# Patient Record
Sex: Female | Born: 1935 | Race: White | Hispanic: No | Smoking: Current every day smoker
Health system: Southern US, Community
[De-identification: ages and names within clinical notes are randomized; demographics above are authoritative.]

## PROBLEM LIST (undated history)

## (undated) DIAGNOSIS — E7800 Pure hypercholesterolemia, unspecified: Secondary | ICD-10-CM

## (undated) DIAGNOSIS — I2510 Atherosclerotic heart disease of native coronary artery without angina pectoris: Secondary | ICD-10-CM

## (undated) DIAGNOSIS — I10 Essential (primary) hypertension: Secondary | ICD-10-CM

## (undated) DIAGNOSIS — M810 Age-related osteoporosis without current pathological fracture: Secondary | ICD-10-CM

## (undated) DIAGNOSIS — I252 Old myocardial infarction: Secondary | ICD-10-CM

## (undated) DIAGNOSIS — K219 Gastro-esophageal reflux disease without esophagitis: Secondary | ICD-10-CM

## (undated) DIAGNOSIS — I639 Cerebral infarction, unspecified: Secondary | ICD-10-CM

## (undated) DIAGNOSIS — I4891 Unspecified atrial fibrillation: Secondary | ICD-10-CM

## (undated) DIAGNOSIS — B029 Zoster without complications: Secondary | ICD-10-CM

## (undated) DIAGNOSIS — I219 Acute myocardial infarction, unspecified: Secondary | ICD-10-CM

## (undated) HISTORY — DX: Acute myocardial infarction, unspecified: I21.9

## (undated) HISTORY — DX: Essential (primary) hypertension: I10

## (undated) HISTORY — PX: ANGIOPLASTY: SHX39

## (undated) HISTORY — DX: Zoster without complications: B02.9

## (undated) HISTORY — DX: Unspecified atrial fibrillation: I48.91

## (undated) HISTORY — DX: Gastro-esophageal reflux disease without esophagitis: K21.9

## (undated) HISTORY — DX: Pure hypercholesterolemia, unspecified: E78.00

## (undated) HISTORY — DX: Cerebral infarction, unspecified: I63.9

## (undated) HISTORY — PX: DILATION AND CURETTAGE OF UTERUS: SHX78

## (undated) HISTORY — DX: Age-related osteoporosis without current pathological fracture: M81.0

## (undated) HISTORY — PX: SKIN CANCER EXCISION: SHX779

---
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
## 1981-08-03 HISTORY — PX: GALLBLADDER SURGERY: SHX652

## 1998-10-25 ENCOUNTER — Other Ambulatory Visit: Admission: RE | Admit: 1998-10-25 | Discharge: 1998-10-25 | Payer: Self-pay | Admitting: Gynecology

## 1999-01-26 ENCOUNTER — Encounter: Payer: Self-pay | Admitting: Emergency Medicine

## 1999-01-26 ENCOUNTER — Emergency Department (HOSPITAL_COMMUNITY): Admission: EM | Admit: 1999-01-26 | Discharge: 1999-01-26 | Payer: Self-pay | Admitting: Emergency Medicine

## 1999-04-17 ENCOUNTER — Other Ambulatory Visit: Admission: RE | Admit: 1999-04-17 | Discharge: 1999-04-17 | Payer: Self-pay | Admitting: Gynecology

## 1999-09-30 ENCOUNTER — Encounter: Payer: Self-pay | Admitting: Gynecology

## 1999-09-30 ENCOUNTER — Encounter: Admission: RE | Admit: 1999-09-30 | Discharge: 1999-09-30 | Payer: Self-pay | Admitting: Gynecology

## 1999-11-25 ENCOUNTER — Other Ambulatory Visit: Admission: RE | Admit: 1999-11-25 | Discharge: 1999-11-25 | Payer: Self-pay | Admitting: Gynecology

## 2000-09-30 ENCOUNTER — Encounter: Payer: Self-pay | Admitting: Gynecology

## 2000-09-30 ENCOUNTER — Encounter: Admission: RE | Admit: 2000-09-30 | Discharge: 2000-09-30 | Payer: Self-pay | Admitting: Gynecology

## 2000-10-28 ENCOUNTER — Encounter: Payer: Self-pay | Admitting: Gastroenterology

## 2000-10-28 ENCOUNTER — Encounter: Admission: RE | Admit: 2000-10-28 | Discharge: 2000-10-28 | Payer: Self-pay | Admitting: Gastroenterology

## 2000-11-26 ENCOUNTER — Other Ambulatory Visit: Admission: RE | Admit: 2000-11-26 | Discharge: 2000-11-26 | Payer: Self-pay | Admitting: Gynecology

## 2001-05-24 ENCOUNTER — Ambulatory Visit (HOSPITAL_BASED_OUTPATIENT_CLINIC_OR_DEPARTMENT_OTHER): Admission: RE | Admit: 2001-05-24 | Discharge: 2001-05-24 | Payer: Self-pay | Admitting: General Surgery

## 2001-05-24 ENCOUNTER — Encounter (INDEPENDENT_AMBULATORY_CARE_PROVIDER_SITE_OTHER): Payer: Self-pay | Admitting: *Deleted

## 2001-10-12 ENCOUNTER — Encounter: Admission: RE | Admit: 2001-10-12 | Discharge: 2001-10-12 | Payer: Self-pay | Admitting: Gynecology

## 2001-10-12 ENCOUNTER — Encounter: Payer: Self-pay | Admitting: Gynecology

## 2001-12-20 ENCOUNTER — Other Ambulatory Visit: Admission: RE | Admit: 2001-12-20 | Discharge: 2001-12-20 | Payer: Self-pay | Admitting: Gynecology

## 2002-01-06 ENCOUNTER — Encounter: Payer: Self-pay | Admitting: Gynecology

## 2002-01-06 ENCOUNTER — Encounter: Admission: RE | Admit: 2002-01-06 | Discharge: 2002-01-06 | Payer: Self-pay | Admitting: Gynecology

## 2002-07-14 ENCOUNTER — Encounter (INDEPENDENT_AMBULATORY_CARE_PROVIDER_SITE_OTHER): Payer: Self-pay | Admitting: Specialist

## 2002-07-14 ENCOUNTER — Ambulatory Visit (HOSPITAL_COMMUNITY): Admission: RE | Admit: 2002-07-14 | Discharge: 2002-07-14 | Payer: Self-pay | Admitting: Gastroenterology

## 2002-10-25 ENCOUNTER — Encounter: Admission: RE | Admit: 2002-10-25 | Discharge: 2002-10-25 | Payer: Self-pay | Admitting: Gynecology

## 2002-10-25 ENCOUNTER — Encounter: Payer: Self-pay | Admitting: Gynecology

## 2003-10-29 ENCOUNTER — Encounter: Admission: RE | Admit: 2003-10-29 | Discharge: 2003-10-29 | Payer: Self-pay | Admitting: Gynecology

## 2003-11-15 ENCOUNTER — Other Ambulatory Visit: Admission: RE | Admit: 2003-11-15 | Discharge: 2003-11-15 | Payer: Self-pay | Admitting: Gynecology

## 2004-11-05 ENCOUNTER — Encounter: Admission: RE | Admit: 2004-11-05 | Discharge: 2004-11-05 | Payer: Self-pay | Admitting: Gynecology

## 2005-09-23 ENCOUNTER — Encounter: Admission: RE | Admit: 2005-09-23 | Discharge: 2005-09-23 | Payer: Self-pay | Admitting: Orthopedic Surgery

## 2005-10-07 ENCOUNTER — Encounter: Admission: RE | Admit: 2005-10-07 | Discharge: 2005-10-07 | Payer: Self-pay | Admitting: Orthopedic Surgery

## 2005-11-19 ENCOUNTER — Encounter: Admission: RE | Admit: 2005-11-19 | Discharge: 2005-11-19 | Payer: Self-pay | Admitting: Gynecology

## 2005-11-25 ENCOUNTER — Other Ambulatory Visit: Admission: RE | Admit: 2005-11-25 | Discharge: 2005-11-25 | Payer: Self-pay | Admitting: Gynecology

## 2005-12-14 ENCOUNTER — Encounter: Admission: RE | Admit: 2005-12-14 | Discharge: 2005-12-14 | Payer: Self-pay | Admitting: Gastroenterology

## 2006-11-22 ENCOUNTER — Encounter: Admission: RE | Admit: 2006-11-22 | Discharge: 2006-11-22 | Payer: Self-pay | Admitting: Gynecology

## 2006-11-29 ENCOUNTER — Other Ambulatory Visit: Admission: RE | Admit: 2006-11-29 | Discharge: 2006-11-29 | Payer: Self-pay | Admitting: Gynecology

## 2007-10-05 ENCOUNTER — Encounter: Admission: RE | Admit: 2007-10-05 | Discharge: 2007-10-05 | Payer: Self-pay | Admitting: Gastroenterology

## 2007-11-30 ENCOUNTER — Other Ambulatory Visit: Admission: RE | Admit: 2007-11-30 | Discharge: 2007-11-30 | Payer: Self-pay | Admitting: Gynecology

## 2007-12-05 ENCOUNTER — Encounter: Admission: RE | Admit: 2007-12-05 | Discharge: 2007-12-05 | Payer: Self-pay | Admitting: Gynecology

## 2008-01-05 ENCOUNTER — Encounter: Admission: RE | Admit: 2008-01-05 | Discharge: 2008-01-05 | Payer: Self-pay | Admitting: Orthopedic Surgery

## 2008-12-05 ENCOUNTER — Encounter: Admission: RE | Admit: 2008-12-05 | Discharge: 2008-12-05 | Payer: Self-pay | Admitting: Gynecology

## 2008-12-12 ENCOUNTER — Encounter: Payer: Self-pay | Admitting: Gynecology

## 2008-12-12 ENCOUNTER — Other Ambulatory Visit: Admission: RE | Admit: 2008-12-12 | Discharge: 2008-12-12 | Payer: Self-pay | Admitting: Gynecology

## 2008-12-12 ENCOUNTER — Ambulatory Visit: Payer: Self-pay | Admitting: Gynecology

## 2009-12-17 ENCOUNTER — Encounter: Admission: RE | Admit: 2009-12-17 | Discharge: 2009-12-17 | Payer: Self-pay | Admitting: Gynecology

## 2010-01-16 ENCOUNTER — Other Ambulatory Visit: Admission: RE | Admit: 2010-01-16 | Discharge: 2010-01-16 | Payer: Self-pay | Admitting: Gynecology

## 2010-01-16 ENCOUNTER — Ambulatory Visit: Payer: Self-pay | Admitting: Gynecology

## 2010-02-25 ENCOUNTER — Ambulatory Visit: Payer: Self-pay | Admitting: Gynecology

## 2010-08-24 ENCOUNTER — Encounter: Payer: Self-pay | Admitting: Orthopedic Surgery

## 2010-11-05 ENCOUNTER — Other Ambulatory Visit: Payer: Self-pay | Admitting: Dermatology

## 2010-12-23 ENCOUNTER — Other Ambulatory Visit: Payer: Self-pay | Admitting: Dermatology

## 2010-12-30 ENCOUNTER — Other Ambulatory Visit: Payer: Self-pay | Admitting: Gynecology

## 2010-12-30 DIAGNOSIS — Z1231 Encounter for screening mammogram for malignant neoplasm of breast: Secondary | ICD-10-CM

## 2011-01-14 ENCOUNTER — Ambulatory Visit
Admission: RE | Admit: 2011-01-14 | Discharge: 2011-01-14 | Disposition: A | Discharge status: Home / Self Care | Payer: Medicare Other | Source: Ambulatory Visit | Attending: Gynecology | Admitting: Gynecology

## 2011-01-14 DIAGNOSIS — Z1231 Encounter for screening mammogram for malignant neoplasm of breast: Secondary | ICD-10-CM

## 2011-01-20 ENCOUNTER — Encounter (INDEPENDENT_AMBULATORY_CARE_PROVIDER_SITE_OTHER): Payer: Medicare Other | Admitting: Gynecology

## 2011-01-20 DIAGNOSIS — R82998 Other abnormal findings in urine: Secondary | ICD-10-CM

## 2011-01-20 DIAGNOSIS — N952 Postmenopausal atrophic vaginitis: Secondary | ICD-10-CM

## 2011-07-06 ENCOUNTER — Other Ambulatory Visit: Payer: Self-pay | Admitting: Dermatology

## 2011-12-08 ENCOUNTER — Other Ambulatory Visit: Payer: Self-pay | Admitting: Gynecology

## 2011-12-08 DIAGNOSIS — Z1231 Encounter for screening mammogram for malignant neoplasm of breast: Secondary | ICD-10-CM

## 2012-01-18 ENCOUNTER — Ambulatory Visit
Admission: RE | Admit: 2012-01-18 | Discharge: 2012-01-18 | Disposition: A | Discharge status: Home / Self Care | Payer: Medicare Other | Source: Ambulatory Visit | Attending: Gynecology | Admitting: Gynecology

## 2012-01-18 DIAGNOSIS — Z1231 Encounter for screening mammogram for malignant neoplasm of breast: Secondary | ICD-10-CM

## 2012-01-20 ENCOUNTER — Other Ambulatory Visit: Payer: Self-pay | Admitting: *Deleted

## 2012-01-20 DIAGNOSIS — N63 Unspecified lump in unspecified breast: Secondary | ICD-10-CM

## 2012-01-21 ENCOUNTER — Ambulatory Visit (INDEPENDENT_AMBULATORY_CARE_PROVIDER_SITE_OTHER): Payer: Medicare Other | Admitting: Gynecology

## 2012-01-21 ENCOUNTER — Encounter: Payer: Self-pay | Admitting: Gynecology

## 2012-01-21 VITALS — BP 130/80 | Ht 62.5 in | Wt 160.0 lb

## 2012-01-21 DIAGNOSIS — M4000 Postural kyphosis, site unspecified: Secondary | ICD-10-CM

## 2012-01-21 DIAGNOSIS — B029 Zoster without complications: Secondary | ICD-10-CM | POA: Insufficient documentation

## 2012-01-21 DIAGNOSIS — N8111 Cystocele, midline: Secondary | ICD-10-CM

## 2012-01-21 DIAGNOSIS — I10 Essential (primary) hypertension: Secondary | ICD-10-CM | POA: Insufficient documentation

## 2012-01-21 DIAGNOSIS — K219 Gastro-esophageal reflux disease without esophagitis: Secondary | ICD-10-CM | POA: Insufficient documentation

## 2012-01-21 DIAGNOSIS — I219 Acute myocardial infarction, unspecified: Secondary | ICD-10-CM | POA: Insufficient documentation

## 2012-01-21 DIAGNOSIS — M8580 Other specified disorders of bone density and structure, unspecified site: Secondary | ICD-10-CM | POA: Insufficient documentation

## 2012-01-21 DIAGNOSIS — N952 Postmenopausal atrophic vaginitis: Secondary | ICD-10-CM

## 2012-01-21 DIAGNOSIS — E7800 Pure hypercholesterolemia, unspecified: Secondary | ICD-10-CM | POA: Insufficient documentation

## 2012-01-21 DIAGNOSIS — M949 Disorder of cartilage, unspecified: Secondary | ICD-10-CM

## 2012-01-21 DIAGNOSIS — I4891 Unspecified atrial fibrillation: Secondary | ICD-10-CM | POA: Insufficient documentation

## 2012-01-21 DIAGNOSIS — M40209 Unspecified kyphosis, site unspecified: Secondary | ICD-10-CM

## 2012-01-21 NOTE — Progress Notes (Signed)
Chloe Neal [facility] Feb 17, 1929 [id number]        78 y.o.  for follow up exam.    Past medical history,surgical history, medications, allergies, family history and social history were all reviewed and documented in the EPIC chart. ROS:  Was performed and pertinent positives and negatives are included in the history.  Exam: Chloe Neal chaperone present Filed Vitals:   01/21/12 0946  BP: 130/80   General appearance  Normal Skin grossly normal Head/Neck normal with no cervical or supraclavicular adenopathy thyroid normal Lungs  Clear, kyphosis upper thorax noted Cardiac irregular rhythm, without RMG Abdominal  soft, nontender, without masses, organomegaly or hernia Breasts  examined lying and sitting without masses, retractions, discharge or axillary adenopathy. Pelvic  Ext/BUS/vagina  normal with atrophic changes. Mild midline cystocele.  Cervix  normal with atrophic changes  Uterus  axial, normal size, shape and contour, midline and mobile nontender   Adnexa  Without masses or tenderness    Anus and perineum  normal   Rectovaginal  normal sphincter tone without palpated masses or tenderness.    Assessment/Plan:  78 y.o. female for follow up exam.    1. Atrophic genital changes/mild cystocele. Patient's asymptomatic and we'll continue to monitor. 2. Kyphosis/osteopenia. Patient due for follow up DEXA now. Her spine on review it is somewhat difficult to interpret due to degenerative changes. Going to order a VFA along with the DEXA. 3. Mammography. Patient has follow up views scheduled and will follow up for those. SBE monthly reviewed. 4. Pap smear. Last Pap smear 2011. No Pap smear done today. She has no history of abnormal Pap smears before with numerous normal reports in her chart. Reviewed current screening guidelines and we will stop doing screening as she is over 65. 5. Colonoscopy. She is up-to-date with colonoscopy and will follow up at recommended intervals. 6. Health maintenance. No  blood work was done today this is all done through her other physicians who follow her for her medical issues.    Demeshia Lords MD, 10:14 AM 01/21/2012

## 2012-01-21 NOTE — Patient Instructions (Signed)
Follow up for bone density study as scheduled. Follow up with me in one year for follow up exam.

## 2012-01-27 ENCOUNTER — Encounter: Payer: Self-pay | Admitting: Gynecology

## 2012-02-10 ENCOUNTER — Ambulatory Visit
Admission: RE | Admit: 2012-02-10 | Discharge: 2012-02-10 | Disposition: A | Discharge status: Home / Self Care | Payer: Medicare Other | Source: Ambulatory Visit | Attending: Gynecology | Admitting: Gynecology

## 2012-02-10 DIAGNOSIS — N63 Unspecified lump in unspecified breast: Secondary | ICD-10-CM

## 2012-03-09 ENCOUNTER — Ambulatory Visit (INDEPENDENT_AMBULATORY_CARE_PROVIDER_SITE_OTHER): Payer: Medicare Other

## 2012-03-09 DIAGNOSIS — M810 Age-related osteoporosis without current pathological fracture: Secondary | ICD-10-CM

## 2012-03-09 DIAGNOSIS — M40209 Unspecified kyphosis, site unspecified: Secondary | ICD-10-CM

## 2012-03-09 DIAGNOSIS — M8580 Other specified disorders of bone density and structure, unspecified site: Secondary | ICD-10-CM

## 2012-03-09 DIAGNOSIS — M4000 Postural kyphosis, site unspecified: Secondary | ICD-10-CM

## 2012-03-19 DIAGNOSIS — M810 Age-related osteoporosis without current pathological fracture: Secondary | ICD-10-CM

## 2012-03-19 HISTORY — DX: Age-related osteoporosis without current pathological fracture: M81.0

## 2012-03-25 ENCOUNTER — Encounter: Payer: Self-pay | Admitting: Gynecology

## 2012-03-25 ENCOUNTER — Telehealth: Payer: Self-pay | Admitting: Gynecology

## 2012-03-25 NOTE — Telephone Encounter (Signed)
Recommend OV to discuss bone density results.

## 2012-03-28 NOTE — Telephone Encounter (Signed)
SPOKE W/ MS Kashani, APPT SCHEDULED FOR Friday SEPT 6 @ 10:20 W/DR TF TO DISCUSS DEXA/DBM

## 2012-04-08 ENCOUNTER — Ambulatory Visit (INDEPENDENT_AMBULATORY_CARE_PROVIDER_SITE_OTHER): Payer: Medicare Other | Admitting: Gynecology

## 2012-04-08 ENCOUNTER — Encounter: Payer: Self-pay | Admitting: Gynecology

## 2012-04-08 DIAGNOSIS — M810 Age-related osteoporosis without current pathological fracture: Secondary | ICD-10-CM

## 2012-04-08 MED ORDER — ALENDRONATE SODIUM 70 MG PO TABS: 70.0000 mg | ORAL_TABLET | ORAL | Status: DC

## 2012-04-08 NOTE — Progress Notes (Signed)
Patient presents to discuss her recent DEXA and VFA.  Her DEXA shows osteoporosis in her forearm. Degenerative changes in the lower lumbar spine. Her VFA shows evidence of some compression fractures although this is a little more difficult to interpret.  I reviewed all these findings with her and given her age and a history of a fall with fracture in her forearm years ago I've recommended that we start treatment. Options were reviewed with her to include bisphosphonates, Prolia, Evista/hormonal, Forteo. Pros/cons, risks/benefits of choice is reviewed. We'll start with alendronate 70 mg weekly. How to take the medication and side effect profile were reviewed. Risks of reflux, esophageal cancer, osteonecrosis of the jaw atypical fractures reviewed. Arrhythmias with IV also discussed. Patient is comfortable starting and assuming she does well then we'll repeat her DEXA in 2 years.

## 2012-04-08 NOTE — Patient Instructions (Signed)
Osteoporosis  Osteoporosis is a disease of the bones that makes them weaker and prone to break (fracture). By their mid-30s, most people begin to gradually lose bone strength. If this is severe enough, osteoporosis may occur. Osteopenia is a less severe weakness of the bones, which places you at risk for osteoporosis. It is important to identify if you have osteoporosis or osteopenia. Bone fractures from osteoporosis (especially hip and spine fractures) are a major cause of hospitalization, loss of independence, and can lead to life-threatening complications.  CAUSES    There are a number of causes and risk factors:   Gender. Women are at a higher risk for osteoporosis than men.    Age. Bone formation slows down with age.    Ethnicity. For unclear reasons, white and Asian women are at higher risk for osteoporosis. Hispanic and African American women are at increased, but lesser, risk.    Family history of osteoporosis can mean that you are at a higher risk for getting it.    History of bone fractures indicates you may be at higher risk of another.    Calcium is very important for bone health and strength. Not enough calcium in your diet increases your risk for osteoporosis. Vitamin D is important for calcium metabolism. You get vitamin D from sunlight, foods, or supplements.    Physical activity. Bones get stronger with weight-bearing exercise and weaker without use.    Smoking is associated with decreased bone strength.    Medicines. Cortisone medicines, too much thyroid medicine, some cancer and seizure medicines, and others can weaken bones and cause osteoporosis.    Decreased body weight is associated with osteoporosis. The small amount of estrogen-type molecules produced in fat cells seems to protect the bones.    Menopausal decrease in the hormone estrogen can cause osteoporosis.    Low levels of the hormone testosterone can cause osteoporosis.     Some medical conditions can lead to osteoporosis (hyperthyroidism, hyperparathyroidism, B12 deficiency).   SYMPTOMS    Usually, no symptoms are felt as the bones weaken. The first symptoms are generally related to bone fractures. You may have silent, tiny bone fractures, especially in your spine. This can cause height loss and forward bending of the spine (kyphosis).  DIAGNOSIS    You or your caregiver may suspect osteoporosis based on height loss and kyphosis. Osteoporosis or osteopenia may be identified on an X-ray done for other reasons. A bone density measurement will likely be taken. Your bones are often measured at your lower spine or your hips. Measurement is done by an X-ray called a DEXA scan, or sometimes by a computerized X-ray scan (CT or CAT scan). Other tests may be done to find the cause of osteoporosis, such as blood tests to measure calcium and vitamin D, or to monitor treatment.  TREATMENT    The goal of osteoporosis treatment is to prevent fractures. This is done through medicine and home care treatments. Treatment will slow the weakening of your bones and strengthen them where possible. Measures to decrease the likelihood of falling and fracturing a bone are also important.  Medicine   You may need supplements if you are not getting enough calcium, vitamin D, and vitamin B12.    If you are female and menopausal, you should discuss the option of estrogen replacement or estrogen-like medicine with your caregiver.    Medicines can be taken by mouth or injection to help build bone strength. When taken by mouth, there are important directions   that you need to follow.    Calcitonin is a hormone made by the thyroid gland that can help build bone strength and decrease fracture risk in the spine. It can be taken by nasal spray or injection.    Parathyroid hormone can be injected to help build bone strength.    You will need to continue to get enough calcium intake with any of these medicines.    FALL PREVENTION   If you are unsteady on your feet, use a cane, walker, or walk with someone's help.    Remove loose rugs or electrical cords from your home.    Keep your home well lit at night. Use glasses if you need them.    Avoid icy streets and wet or waxed floors.    Hold the railing when using stairs.    Watch out for your pets.    Install grab bars in your bathroom.    Exercise. Physical activity, especially weight-bearing exercise, helps strengthen bones. Strength and balance exercise, such as tai chi, helps prevent falls.    Alcohol and some medicines can make you more likely to fall. Discuss alcohol use with your caregiver. Ask your caregiver if any of your medicines might increase your risk for falling. Ask if safer alternatives are available.   HOME CARE INSTRUCTIONS     Try to prevent and avoid falls.    To pick up objects, bend at the knees. Do not bend with your back.    Do not smoke. If you smoke, ask for help to stop.    Have adequate calcium and vitamin D in your diet. Talk with your caregiver about amounts.    Before exercising, ask your caregiver what exercises will be good for you.    Only take over-the-counter or prescription medicines for pain, discomfort, or fever as directed by your caregiver.   SEEK MEDICAL CARE IF:     You have had a fracture and your pain is not controlled.    You have had a fracture and you are not able to return to activities as expected.    You are reinjured.    You develop side effects from medicines, especially stomach pain or trouble swallowing.    You develop new, unexplained problems.   SEEK IMMEDIATE MEDICAL CARE IF:     You develop sudden, severe pain in your back.    You develop pain after an injury or fall.   Document Released: 04/29/2005 Document Revised: 07/09/2011 Document Reviewed: 07/04/2011  [employer] Patient Information 2012 [employer].

## 2012-05-02 ENCOUNTER — Telehealth: Payer: Self-pay | Admitting: *Deleted

## 2012-05-02 NOTE — Telephone Encounter (Signed)
I don't think that this is a reaction to Fosamax as it is not commonly reported. I recommend she call her primary physician's office and asked them why they think she is swelling. Asked them if they think it is a side effect of her Fosamax and we can consider switching but otherwise I'm not sure switching to anything else would make a big difference.

## 2012-05-02 NOTE — Telephone Encounter (Signed)
Pt has completed 3 weeks of generic Fosomax, she noticed she began having swelling in feet ankles and legs. She went to her PCP and they gave her Fluid pills and potassium since that was low. She asks if this is a reaction to the Fosomax and if she could get another prescription instead of the Generic Fosomax. Pls advise

## 2012-05-03 NOTE — Telephone Encounter (Signed)
Informed and will follow up with PCP KW

## 2012-05-25 ENCOUNTER — Other Ambulatory Visit: Payer: Self-pay | Admitting: Internal Medicine

## 2012-05-25 ENCOUNTER — Ambulatory Visit
Admission: RE | Admit: 2012-05-25 | Discharge: 2012-05-25 | Disposition: A | Discharge status: Home / Self Care | Payer: Medicare Other | Source: Ambulatory Visit | Attending: Internal Medicine | Admitting: Internal Medicine

## 2012-05-25 DIAGNOSIS — R279 Unspecified lack of coordination: Secondary | ICD-10-CM

## 2012-05-25 DIAGNOSIS — R609 Edema, unspecified: Secondary | ICD-10-CM

## 2012-05-25 DIAGNOSIS — R0989 Other specified symptoms and signs involving the circulatory and respiratory systems: Secondary | ICD-10-CM

## 2012-05-26 ENCOUNTER — Other Ambulatory Visit: Payer: Self-pay | Admitting: Internal Medicine

## 2012-05-26 DIAGNOSIS — R609 Edema, unspecified: Secondary | ICD-10-CM

## 2012-05-30 ENCOUNTER — Ambulatory Visit
Admission: RE | Admit: 2012-05-30 | Discharge: 2012-05-30 | Disposition: A | Discharge status: Home / Self Care | Payer: Medicare Other | Source: Ambulatory Visit | Attending: Internal Medicine | Admitting: Internal Medicine

## 2012-05-30 DIAGNOSIS — R279 Unspecified lack of coordination: Secondary | ICD-10-CM

## 2012-05-30 DIAGNOSIS — R0989 Other specified symptoms and signs involving the circulatory and respiratory systems: Secondary | ICD-10-CM

## 2012-05-30 MED ORDER — IOHEXOL 300 MG/ML  SOLN
75.0000 mL | Freq: Once | INTRAMUSCULAR | Status: AC | PRN
  Administered 2012-05-30: 75 mL via INTRAVENOUS

## 2012-06-01 ENCOUNTER — Other Ambulatory Visit: Payer: Self-pay | Admitting: Internal Medicine

## 2012-06-01 ENCOUNTER — Ambulatory Visit
Admission: RE | Admit: 2012-06-01 | Discharge: 2012-06-01 | Disposition: A | Discharge status: Home / Self Care | Payer: Medicare Other | Source: Ambulatory Visit | Attending: Internal Medicine | Admitting: Internal Medicine

## 2012-06-01 VITALS — BP 171/82 | HR 94 | Temp 98.6°F | Resp 18 | Ht 63.0 in | Wt 155.0 lb

## 2012-06-01 DIAGNOSIS — R609 Edema, unspecified: Secondary | ICD-10-CM

## 2012-06-02 NOTE — Consult Note (Signed)
INTERVENTIONAL RADIOLOGY NEW PATIENT EVALUATION  Reason for Consult: Lower extremity edema, evaluate for possible venous insufficiency Referring Physician: Marden Noble, MD   HPI: Chloe Neal is an 78 y.o. female with a past medical history significant for hypertension, acute myocardial infarction in 24-Dec-1992 status post PTCA of the right coronary artery, and atrial fibrillation who developed relatively sudden onset of lower extremity edema in mid September.  Upon questioning, she has had intermittent edema prior to this however; it has always been self-limited and not very bothersome.  In the month of September, she feels that she suddenly noticed that her legs were swollen and the swelling was persistent.  She reports that it is not improved with elevation and does not seem to be affected or exacerbated by prolonged standing or activity.  The edema remains constant throughout the day and is neither better nor worse in the morning or evening.  The onset of her symptoms was temporally related to beginning Fosamax which has since been discontinued.  She feels that her edema is similar to perhaps incrementally better since stopping the Fosamax.  She has some mild blistering of the skin along the anterior aspect of the shins.  She denies pain, claudication, heaviness in the legs, burning or itching associated with the edema.  She reports that she walks with the assistance of a cane and can walk up to one mile before she becomes tired.  She denies history of a prior DVT, significant leg injury or surgery.  Past Medical History:   Hypertension, acute myocardial infarction status post PTCA of the right coronary artery in 12-24-92, hypercholesterolemia, COPD (stopped smoking March 2013), idiopathic thrombocytopenic purpura, nocturia, shingles, diverticulosis, bilateral hearing aids, degenerative disk disease with chronic low back pain, bilateral carotid artery disease, multinodular goiter, and macular  degeneration.  Past Surgical History:   Tonsillectomy, cholecystectomy, uterine polypectomy, toe surgery, removal of facial sebaceous cyst, and aspiration of a breast cyst.  Family History:   History of coronary artery disease in her father who passed away at 1 years old secondary to myocardial infarction.  Her mother also had peripheral vascular disease with a history of cerebrovascular accident.      Social History:   One pack per day smoker for 50 years; she stopped smoking in March of 2013.  She does consume caffeine, approximately 3-6 cups daily.  She denies alcohol or illicit drug use.  She is retired; she was previously an Environmental health practitioner at [employer].  She is currently a widow after being married for 53 years.  Her husband passed away in 12/25/02.  She currently lives alone.  She has two children, including her daughter who is at her appointment with her today.  Medications:   Warfarin 5 mg po Monday and Friday and 2.5 mg po on the other days of the week.  Aspirin 81 mg po, vitamin D 2000 units po, Prilosec OTC 20 mg po, calcium 600+D po, multivitamin tablet po, Ocuvite tablet po, Biotin 1000 mcg tablet po, Nitrostat 0.4 mg sublingual prn, CoQ10 100 mg po, Spiriva 18 mcg inhaler prn, Cardizem 300 mg extended release po, Losartin potassium 100 mg po, Lipitor 20 mg po, furosemide 20 mg 1-2 tablets po, potassium chloride 10 mEq extended release tablets 1-2 daily, oxybutynin 10 mg daily.    Allergies:  History of syncopal reaction with metoprolol, unlikely a true allergy.  Physical Exam:   Vital signs: Blood pressure 171/82, heart rate 94, respiratory rate 18, oxygen saturation 98%, temperature 98.6 F. Cardiac:  Irregularly irregular rhythm.  No murmurs, rubs or gallops.   General:  Very pleasant, well kempt female in no acute distress who appears younger than her stated age.   Pulmonary:  Clear to auscultation bilaterally.   Abdomen:  Soft, nontender, nondistended.  No pulsatile  mass.  No bruit.  Lower extremities:  There is 2+ pitting edema extending from the dorsum of the foot to the upper third of the lower leg.  The edema stops before the knee.  The edema is predominantly along the anterior surface of the shin and dorsum of the foot.  There is fairly low edema along the posterior aspect of the calf.  There are several small fluid-containing vesicles in the skin of the anterior calves bilaterally without a significant weeping.  No significant venous dermatitis or lipodermatosclerosis.  No trophic changes along the malleoli in the typical gaiter distribution.  The patient does have some trophic changes in her toenails consistent with mild peripheral arterial disease.  No definite arterial or venous ulcers identified.  No significant venous varicosities.  There are multiple small spider and reticular veins along the medial aspect of the bilateral lower extremities in the upper thigh and around the knees.   Pulses:  2+ palpable femoral pulses bilaterally but Doppler positive dorsalis pedis exam posterior tibial pulses on the left and posterior tibial pulse on the right.  The right dorsalis pedis artery is not detectable by Doppler ultrasound.   Neuro:  Alert and oriented x3.  Normal affect.   Review of Symptoms:  A 14-point review of symptoms was negative save for the pertinent positives listed in the history of the present illness.   Imaging:  Venous duplex ultrasound performed in clinic, 06/01/12 showed no evidence of deep venous thrombosis or venous insufficiency.  Assessment & Plan:    Chloe Neal is an 78 year old female with a relatively sudden onset of bilateral lower extremity pitting edema approximately six weeks ago.  Her edema has been persistent and relatively unchanging, and does not seem to respond to elevation.  She does not have evidence of venous insufficiency/reflux by Doppler ultrasonography.  Additionally, her edema distribution is predominantly anterior and  she does not show classical clinical signs of venous stasis dermatitis.    We could consider evaluating her thyroid function as hypothyroidism can be associated with pretibial edema.  I feel that her lower extremity edema may be multifactorial.   I gave her a prescription for knee-high TED hose.  I think the compression may help her underlying edema.   It was a pleasure seeing Chloe Neal in clinic today.  If I can be of any further assistance, I would be more than happy to see her again.   Signed,  Sterling Big, MD Vascular and Interventional Radiologist [facility]

## 2012-06-03 ENCOUNTER — Other Ambulatory Visit: Payer: Self-pay | Admitting: Family Medicine

## 2012-06-03 DIAGNOSIS — R609 Edema, unspecified: Secondary | ICD-10-CM

## 2012-06-06 ENCOUNTER — Ambulatory Visit
Admission: RE | Admit: 2012-06-06 | Discharge: 2012-06-06 | Disposition: A | Discharge status: Home / Self Care | Payer: Medicare Other | Source: Ambulatory Visit | Attending: Family Medicine | Admitting: Family Medicine

## 2012-06-06 DIAGNOSIS — R609 Edema, unspecified: Secondary | ICD-10-CM

## 2012-06-06 MED ORDER — IOHEXOL 300 MG/ML  SOLN
100.0000 mL | Freq: Once | INTRAMUSCULAR | Status: AC | PRN
  Administered 2012-06-06: 100 mL via INTRAVENOUS

## 2012-06-22 ENCOUNTER — Other Ambulatory Visit: Payer: Self-pay | Admitting: Internal Medicine

## 2012-06-22 DIAGNOSIS — H5340 Unspecified visual field defects: Secondary | ICD-10-CM

## 2012-06-26 ENCOUNTER — Ambulatory Visit
Admission: RE | Admit: 2012-06-26 | Discharge: 2012-06-26 | Disposition: A | Discharge status: Home / Self Care | Payer: Medicare Other | Source: Ambulatory Visit | Attending: Internal Medicine | Admitting: Internal Medicine

## 2012-06-26 DIAGNOSIS — H5340 Unspecified visual field defects: Secondary | ICD-10-CM

## 2012-07-08 ENCOUNTER — Other Ambulatory Visit: Payer: Self-pay | Admitting: Neurology

## 2012-07-08 DIAGNOSIS — I4891 Unspecified atrial fibrillation: Secondary | ICD-10-CM

## 2012-07-08 DIAGNOSIS — I679 Cerebrovascular disease, unspecified: Secondary | ICD-10-CM

## 2012-07-08 DIAGNOSIS — I6340 Cerebral infarction due to embolism of unspecified cerebral artery: Secondary | ICD-10-CM

## 2012-07-12 ENCOUNTER — Ambulatory Visit
Admission: RE | Admit: 2012-07-12 | Discharge: 2012-07-12 | Disposition: A | Discharge status: Home / Self Care | Payer: Medicare Other | Source: Ambulatory Visit | Attending: Neurology | Admitting: Neurology

## 2012-07-12 DIAGNOSIS — I6340 Cerebral infarction due to embolism of unspecified cerebral artery: Secondary | ICD-10-CM

## 2012-07-12 DIAGNOSIS — I4891 Unspecified atrial fibrillation: Secondary | ICD-10-CM

## 2012-07-12 DIAGNOSIS — I679 Cerebrovascular disease, unspecified: Secondary | ICD-10-CM

## 2012-12-22 ENCOUNTER — Other Ambulatory Visit: Payer: Self-pay

## 2012-12-22 DIAGNOSIS — Z1231 Encounter for screening mammogram for malignant neoplasm of breast: Secondary | ICD-10-CM

## 2013-01-23 ENCOUNTER — Ambulatory Visit (INDEPENDENT_AMBULATORY_CARE_PROVIDER_SITE_OTHER): Payer: Medicare Other | Admitting: Gynecology

## 2013-01-23 ENCOUNTER — Encounter: Payer: Self-pay | Admitting: Gynecology

## 2013-01-23 ENCOUNTER — Telehealth: Payer: Self-pay | Admitting: *Deleted

## 2013-01-23 VITALS — BP 130/80 | Ht 63.0 in | Wt 146.0 lb

## 2013-01-23 DIAGNOSIS — N952 Postmenopausal atrophic vaginitis: Secondary | ICD-10-CM

## 2013-01-23 DIAGNOSIS — M810 Age-related osteoporosis without current pathological fracture: Secondary | ICD-10-CM

## 2013-01-23 DIAGNOSIS — N63 Unspecified lump in unspecified breast: Secondary | ICD-10-CM

## 2013-01-23 DIAGNOSIS — N6320 Unspecified lump in the left breast, unspecified quadrant: Secondary | ICD-10-CM

## 2013-01-23 DIAGNOSIS — N8111 Cystocele, midline: Secondary | ICD-10-CM

## 2013-01-23 NOTE — Progress Notes (Signed)
Chloe Neal [facility] February 05, 1929 [id number]        79 y.o.  [id number] for followup exam.  Several issues noted below.  Past medical history,surgical history, medications, allergies, family history and social history were all reviewed and documented in the EPIC chart.  ROS:  Performed and pertinent positives and negatives are included in the history, assessment and plan .  Exam: Chloe Neal assistant Filed Vitals:   01/23/13 1504  BP: 130/80  Height: 5\' 3"  (1.6 m)  Weight: 146 lb (66.225 kg)   General appearance  Normal Skin grossly normal Head/Neck normal with no cervical or supraclavicular adenopathy thyroid normal Lungs  clear Cardiac RR, without RMG Abdominal  soft, nontender, without masses, organomegaly or hernia Breasts  examined lying and sitting. Right without masses, retractions, discharge or axillary adenopathy. Left with soft well circumscribed 1.5 cm mass 9:00 position 1 fingerbreadth off of the areola. Freely mobile with no overlying skin changes. No nipple discharge or axillary adenopathy. Pelvic  Ext/BUS/vagina  atrophic changes with first degree cystocele  Cervix  atrophic changes   Uterus axial, normal size, shape and contour, midline and mobile nontender   Adnexa  Without masses or tenderness    Anus and perineum  normal   Rectovaginal  normal sphincter tone without palpated masses or tenderness.    Assessment/Plan:  79 y.o. [id number] female for annual exam.   1. Breast mass, left described above. Plan diagnostic mammography and ultrasound. Patient actually has a screening one scheduled tomorrow and we will call over to change this. 2. Osteoporosis. As per note 04/08/2012. Patient started on Fosamax but had some peripheral swelling and stop this. She was to speak to her primary in reference to this but never did. She does have other reasons for swelling and is on Lasix. I reviewed her diagnosis of osteoporosis and really did need to be on some medication to help decrease her  fracture risk. Options to include trial Prolia discussed. The patient would rather start her Fosamax again and see how she does. If she does have recurrent issues with this she'll call in and we'll move towards getting her started on Prolia. The importance of taking some medication to help decrease her fracture risk given her age, instability with walking and bone density showing osteoporosis. 3. Atrophic vaginitis/cystocele. Patient's asymptomatic from this and we'll continue to monitor. 4. Pap smear 2011. No Pap smear done today. No history of significant abnormal Pap smears previously. Plan to stop screening per current screening guidelines that she is over the age of 22 and she is comfortable with this. 5. Colonoscopy 5 years ago. Repeat at their recommended interval. 6. Health maintenance. No lab work done as it is all done through her primary physician's office. Followup for her mammogram ultrasound, otherwise followup one year, sooner as needed.    [name] MD, 3:42 PM 01/23/2013

## 2013-01-23 NOTE — Telephone Encounter (Signed)
Orders placed for the below note.

## 2013-01-23 NOTE — Telephone Encounter (Signed)
Message copied by Aura Camps on Mon Jan 23, 2013  4:15 PM ------      Message from: Rylyn Lords      Created: Mon Jan 23, 2013  3:41 PM       Patient has mammogram scheduled tomorrow as a routine. I felt a lump in her left breast 9:00 position 1 fingerbreadth off the areola. I would like him to do a diagnostic mammogram and ultrasound. She is having it at the breast center. ------

## 2013-01-23 NOTE — Patient Instructions (Signed)
Start back on Fosamax. If you have any issues with this call me. Followup for your mammogram as arranged. Otherwise followup in one year for annual exam, sooner as needed.

## 2013-01-24 ENCOUNTER — Ambulatory Visit: Payer: Medicare Other

## 2013-01-24 NOTE — Telephone Encounter (Signed)
Appt. 02/06/13 @ 9:50 am left message to call regarding this.

## 2013-01-24 NOTE — Telephone Encounter (Signed)
Pt informed with the below note. 

## 2013-02-06 ENCOUNTER — Ambulatory Visit
Admission: RE | Admit: 2013-02-06 | Discharge: 2013-02-06 | Disposition: A | Discharge status: Home / Self Care | Payer: Medicare Other | Source: Ambulatory Visit | Attending: Gynecology | Admitting: Gynecology

## 2013-02-06 DIAGNOSIS — N6320 Unspecified lump in the left breast, unspecified quadrant: Secondary | ICD-10-CM

## 2013-02-23 ENCOUNTER — Telehealth: Payer: Self-pay | Admitting: *Deleted

## 2013-02-23 MED ORDER — ALENDRONATE SODIUM 70 MG PO TABS: 70.0000 mg | ORAL_TABLET | ORAL | Status: AC

## 2013-02-23 NOTE — Telephone Encounter (Signed)
Per note on 01/23/13 okay for pt to have fosamax 70 mg tablets, pt called and would like a 90 day supply sent to pharmacy rx will be sent.

## 2013-04-07 ENCOUNTER — Ambulatory Visit: Payer: Medicare Other | Admitting: Gynecology

## 2013-04-24 ENCOUNTER — Encounter: Payer: Self-pay | Admitting: Gynecology

## 2013-04-24 ENCOUNTER — Ambulatory Visit (INDEPENDENT_AMBULATORY_CARE_PROVIDER_SITE_OTHER): Payer: Medicare Other | Admitting: Gynecology

## 2013-04-24 DIAGNOSIS — N63 Unspecified lump in unspecified breast: Secondary | ICD-10-CM

## 2013-04-24 NOTE — Patient Instructions (Signed)
Followup if you feel any changes in your breasts. Otherwise followup routinely when you're due for your annual exam.

## 2013-04-24 NOTE — Progress Notes (Signed)
Patient follows up having had her routine exam June 2000 4T where her left breast showed a soft well circumscribed 1.5 cm mass 9:00 position 1 fingerbreadth off of the areola. Freely mobile with no overlying skin changes. No nipple discharge or axillary adenopathy. She subsequent had a diagnostic mammography and ultrasound which were normal and showed no abnormalities to include any mass effect. She notes that this area was tender previously but is no longer tender.  Exam was [employer] Both breast examined lying and sitting. Right without masses retractions discharge adenopathy. Left without masses retractions discharge adenopathy. The area previously felt at the 9:00 position is no longer evident.  Assessment and plan: Normal breast exam with negative mammography/ultrasound. Recommend self breast exams. As long as she no longer feels any abnormalities we'll follow expectantly. If any question of any change she knows to followup with me for reexamination. Patient's comfortable with this approach.

## 2013-05-03 ENCOUNTER — Ambulatory Visit (INDEPENDENT_AMBULATORY_CARE_PROVIDER_SITE_OTHER): Payer: Medicare Other | Admitting: Pharmacist

## 2013-05-03 DIAGNOSIS — I4891 Unspecified atrial fibrillation: Secondary | ICD-10-CM

## 2013-05-03 LAB — POCT INR: INR: 2.5

## 2013-05-31 ENCOUNTER — Other Ambulatory Visit: Payer: Self-pay | Admitting: Interventional Cardiology

## 2013-06-14 ENCOUNTER — Ambulatory Visit (INDEPENDENT_AMBULATORY_CARE_PROVIDER_SITE_OTHER): Payer: Medicare Other | Admitting: Pharmacist

## 2013-06-14 DIAGNOSIS — I4891 Unspecified atrial fibrillation: Secondary | ICD-10-CM

## 2013-06-14 LAB — POCT INR: INR: 2.2

## 2013-07-19 ENCOUNTER — Ambulatory Visit (INDEPENDENT_AMBULATORY_CARE_PROVIDER_SITE_OTHER): Payer: Medicare Other | Admitting: Pharmacist

## 2013-07-19 DIAGNOSIS — I4891 Unspecified atrial fibrillation: Secondary | ICD-10-CM

## 2013-07-19 LAB — POCT INR: INR: 3.4

## 2013-08-01 ENCOUNTER — Other Ambulatory Visit: Payer: Self-pay | Admitting: Interventional Cardiology

## 2013-08-04 ENCOUNTER — Encounter (HOSPITAL_COMMUNITY): Payer: Self-pay | Admitting: Emergency Medicine

## 2013-08-04 ENCOUNTER — Emergency Department (HOSPITAL_COMMUNITY): Payer: Medicare Other

## 2013-08-04 ENCOUNTER — Inpatient Hospital Stay (HOSPITAL_COMMUNITY): Payer: Medicare Other

## 2013-08-04 ENCOUNTER — Inpatient Hospital Stay (HOSPITAL_COMMUNITY)
Admission: EM | Admit: 2013-08-04 | Discharge: 2013-09-03 | DRG: 208 | Disposition: E | Payer: Medicare Other | Attending: Pulmonary Disease | Admitting: Pulmonary Disease

## 2013-08-04 DIAGNOSIS — J9600 Acute respiratory failure, unspecified whether with hypoxia or hypercapnia: Principal | ICD-10-CM

## 2013-08-04 DIAGNOSIS — K559 Vascular disorder of intestine, unspecified: Secondary | ICD-10-CM | POA: Diagnosis present

## 2013-08-04 DIAGNOSIS — Z803 Family history of malignant neoplasm of breast: Secondary | ICD-10-CM

## 2013-08-04 DIAGNOSIS — R579 Shock, unspecified: Secondary | ICD-10-CM | POA: Diagnosis present

## 2013-08-04 DIAGNOSIS — R791 Abnormal coagulation profile: Secondary | ICD-10-CM | POA: Diagnosis present

## 2013-08-04 DIAGNOSIS — I319 Disease of pericardium, unspecified: Secondary | ICD-10-CM

## 2013-08-04 DIAGNOSIS — R7309 Other abnormal glucose: Secondary | ICD-10-CM | POA: Diagnosis present

## 2013-08-04 DIAGNOSIS — I469 Cardiac arrest, cause unspecified: Secondary | ICD-10-CM | POA: Diagnosis present

## 2013-08-04 DIAGNOSIS — I498 Other specified cardiac arrhythmias: Secondary | ICD-10-CM | POA: Diagnosis present

## 2013-08-04 DIAGNOSIS — Z823 Family history of stroke: Secondary | ICD-10-CM

## 2013-08-04 DIAGNOSIS — Z515 Encounter for palliative care: Secondary | ICD-10-CM

## 2013-08-04 DIAGNOSIS — R570 Cardiogenic shock: Secondary | ICD-10-CM | POA: Diagnosis present

## 2013-08-04 DIAGNOSIS — I10 Essential (primary) hypertension: Secondary | ICD-10-CM | POA: Diagnosis present

## 2013-08-04 DIAGNOSIS — J4489 Other specified chronic obstructive pulmonary disease: Secondary | ICD-10-CM | POA: Diagnosis present

## 2013-08-04 DIAGNOSIS — Z79899 Other long term (current) drug therapy: Secondary | ICD-10-CM

## 2013-08-04 DIAGNOSIS — D72829 Elevated white blood cell count, unspecified: Secondary | ICD-10-CM | POA: Diagnosis present

## 2013-08-04 DIAGNOSIS — I248 Other forms of acute ischemic heart disease: Secondary | ICD-10-CM | POA: Diagnosis present

## 2013-08-04 DIAGNOSIS — N179 Acute kidney failure, unspecified: Secondary | ICD-10-CM | POA: Diagnosis present

## 2013-08-04 DIAGNOSIS — Z8249 Family history of ischemic heart disease and other diseases of the circulatory system: Secondary | ICD-10-CM

## 2013-08-04 DIAGNOSIS — G9341 Metabolic encephalopathy: Secondary | ICD-10-CM | POA: Diagnosis present

## 2013-08-04 DIAGNOSIS — J449 Chronic obstructive pulmonary disease, unspecified: Secondary | ICD-10-CM | POA: Diagnosis present

## 2013-08-04 DIAGNOSIS — E872 Acidosis, unspecified: Secondary | ICD-10-CM | POA: Diagnosis present

## 2013-08-04 DIAGNOSIS — I219 Acute myocardial infarction, unspecified: Secondary | ICD-10-CM | POA: Diagnosis present

## 2013-08-04 DIAGNOSIS — T45515A Adverse effect of anticoagulants, initial encounter: Secondary | ICD-10-CM | POA: Diagnosis present

## 2013-08-04 DIAGNOSIS — K219 Gastro-esophageal reflux disease without esophagitis: Secondary | ICD-10-CM | POA: Diagnosis present

## 2013-08-04 DIAGNOSIS — K921 Melena: Secondary | ICD-10-CM | POA: Diagnosis present

## 2013-08-04 DIAGNOSIS — I2510 Atherosclerotic heart disease of native coronary artery without angina pectoris: Secondary | ICD-10-CM | POA: Diagnosis present

## 2013-08-04 DIAGNOSIS — Z66 Do not resuscitate: Secondary | ICD-10-CM | POA: Diagnosis present

## 2013-08-04 DIAGNOSIS — I2489 Other forms of acute ischemic heart disease: Secondary | ICD-10-CM | POA: Diagnosis present

## 2013-08-04 DIAGNOSIS — Z951 Presence of aortocoronary bypass graft: Secondary | ICD-10-CM

## 2013-08-04 DIAGNOSIS — I4891 Unspecified atrial fibrillation: Secondary | ICD-10-CM | POA: Diagnosis present

## 2013-08-04 DIAGNOSIS — E7800 Pure hypercholesterolemia, unspecified: Secondary | ICD-10-CM | POA: Diagnosis present

## 2013-08-04 DIAGNOSIS — I252 Old myocardial infarction: Secondary | ICD-10-CM | POA: Insufficient documentation

## 2013-08-04 DIAGNOSIS — F17200 Nicotine dependence, unspecified, uncomplicated: Secondary | ICD-10-CM | POA: Diagnosis present

## 2013-08-04 DIAGNOSIS — Z8673 Personal history of transient ischemic attack (TIA), and cerebral infarction without residual deficits: Secondary | ICD-10-CM

## 2013-08-04 DIAGNOSIS — Z7901 Long term (current) use of anticoagulants: Secondary | ICD-10-CM

## 2013-08-04 HISTORY — DX: Old myocardial infarction: I25.2

## 2013-08-04 HISTORY — DX: Atherosclerotic heart disease of native coronary artery without angina pectoris: I25.10

## 2013-08-04 LAB — GLUCOSE, CAPILLARY: GLUCOSE-CAPILLARY: 131 mg/dL — AB (ref 70–99)

## 2013-08-04 LAB — POCT I-STAT, CHEM 8
BUN: 35 mg/dL — ABNORMAL HIGH (ref 6–23)
CHLORIDE: 112 meq/L (ref 96–112)
Calcium, Ion: 1.23 mmol/L (ref 1.13–1.30)
Creatinine, Ser: 1.9 mg/dL — ABNORMAL HIGH (ref 0.50–1.10)
Glucose, Bld: 118 mg/dL — ABNORMAL HIGH (ref 70–99)
HCT: 42 % (ref 36.0–46.0)
HEMOGLOBIN: 14.3 g/dL (ref 12.0–15.0)
Potassium: 4.2 mEq/L (ref 3.7–5.3)
SODIUM: 144 meq/L (ref 137–147)
TCO2: 15 mmol/L (ref 0–100)

## 2013-08-04 LAB — URINALYSIS, ROUTINE W REFLEX MICROSCOPIC
Bilirubin Urine: NEGATIVE
Glucose, UA: NEGATIVE mg/dL
Ketones, ur: NEGATIVE mg/dL
NITRITE: NEGATIVE
PH: 6 (ref 5.0–8.0)
Protein, ur: 100 mg/dL — AB
SPECIFIC GRAVITY, URINE: 1.013 (ref 1.005–1.030)
Urobilinogen, UA: 0.2 mg/dL (ref 0.0–1.0)

## 2013-08-04 LAB — OCCULT BLOOD, POC DEVICE: Fecal Occult Bld: POSITIVE — AB

## 2013-08-04 LAB — COMPREHENSIVE METABOLIC PANEL
ALK PHOS: 85 U/L (ref 39–117)
ALT: 177 U/L — ABNORMAL HIGH (ref 0–35)
AST: 165 U/L — AB (ref 0–37)
Albumin: 3.1 g/dL — ABNORMAL LOW (ref 3.5–5.2)
BILIRUBIN TOTAL: 1.2 mg/dL (ref 0.3–1.2)
BUN: 36 mg/dL — AB (ref 6–23)
CHLORIDE: 107 meq/L (ref 96–112)
CO2: 14 meq/L — AB (ref 19–32)
CREATININE: 1.74 mg/dL — AB (ref 0.50–1.10)
Calcium: 9.5 mg/dL (ref 8.4–10.5)
GFR calc Af Amer: 30 mL/min — ABNORMAL LOW (ref >90)
GFR, EST NON AFRICAN AMERICAN: 26 mL/min — AB (ref >90)
Glucose, Bld: 124 mg/dL — ABNORMAL HIGH (ref 70–99)
POTASSIUM: 4.5 meq/L (ref 3.7–5.3)
Sodium: 144 mEq/L (ref 137–147)
Total Protein: 5.7 g/dL — ABNORMAL LOW (ref 6.0–8.3)

## 2013-08-04 LAB — POCT I-STAT TROPONIN I: Troponin i, poc: 1.43 ng/mL (ref 0.00–0.08)

## 2013-08-04 LAB — CBC WITH DIFFERENTIAL/PLATELET
Basophils Absolute: 0 10*3/uL (ref 0.0–0.1)
Basophils Relative: 0 % (ref 0–1)
Eosinophils Absolute: 0 10*3/uL (ref 0.0–0.7)
Eosinophils Relative: 0 % (ref 0–5)
HEMATOCRIT: 40.9 % (ref 36.0–46.0)
HEMOGLOBIN: 13.7 g/dL (ref 12.0–15.0)
LYMPHS PCT: 6 % — AB (ref 12–46)
Lymphs Abs: 1.1 10*3/uL (ref 0.7–4.0)
MCH: 32.7 pg (ref 26.0–34.0)
MCHC: 33.5 g/dL (ref 30.0–36.0)
MCV: 97.6 fL (ref 78.0–100.0)
MONO ABS: 1.5 10*3/uL — AB (ref 0.1–1.0)
MONOS PCT: 8 % (ref 3–12)
NEUTROS ABS: 16.4 10*3/uL — AB (ref 1.7–7.7)
NEUTROS PCT: 86 % — AB (ref 43–77)
Platelets: 122 10*3/uL — ABNORMAL LOW (ref 150–400)
RBC: 4.19 MIL/uL (ref 3.87–5.11)
RDW: 13.8 % (ref 11.5–15.5)
WBC: 19 10*3/uL — AB (ref 4.0–10.5)

## 2013-08-04 LAB — PRO B NATRIURETIC PEPTIDE: PRO B NATRI PEPTIDE: 2346 pg/mL — AB (ref 0–450)

## 2013-08-04 LAB — URINE MICROSCOPIC-ADD ON

## 2013-08-04 LAB — POCT I-STAT 3, ART BLOOD GAS (G3+)
ACID-BASE DEFICIT: 16 mmol/L — AB (ref 0.0–2.0)
Bicarbonate: 11.1 mEq/L — ABNORMAL LOW (ref 20.0–24.0)
O2 SAT: 100 %
PO2 ART: 281 mmHg — AB (ref 80.0–100.0)
TCO2: 12 mmol/L (ref 0–100)
pCO2 arterial: 30.2 mmHg — ABNORMAL LOW (ref 35.0–45.0)
pH, Arterial: 7.173 — CL (ref 7.350–7.450)

## 2013-08-04 LAB — ABO/RH: ABO/RH(D): A POS

## 2013-08-04 LAB — LIPASE, BLOOD: LIPASE: 35 U/L (ref 11–59)

## 2013-08-04 LAB — PREPARE RBC (CROSSMATCH)

## 2013-08-04 LAB — CG4 I-STAT (LACTIC ACID): Lactic Acid, Venous: 9.99 mmol/L — ABNORMAL HIGH (ref 0.5–2.2)

## 2013-08-04 LAB — PROTIME-INR
INR: 2.29 — AB (ref 0.00–1.49)
Prothrombin Time: 24.5 seconds — ABNORMAL HIGH (ref 11.6–15.2)

## 2013-08-04 MED ORDER — INSULIN ASPART 100 UNIT/ML ~~LOC~~ SOLN: 0.0000 [IU] | SUBCUTANEOUS | Status: DC

## 2013-08-04 MED ORDER — PANTOPRAZOLE SODIUM 40 MG IV SOLR
40.0000 mg | Freq: Two times a day (BID) | INTRAVENOUS | Status: DC
  Filled 2013-08-04: qty 40

## 2013-08-04 MED ORDER — MIDAZOLAM HCL 2 MG/2ML IJ SOLN
2.0000 mg | INTRAMUSCULAR | Status: DC | PRN
  Filled 2013-08-04 (×2): qty 2

## 2013-08-04 MED ORDER — DEXTROSE 5 % IV SOLN
2.0000 ug/min | INTRAVENOUS | Status: DC
  Filled 2013-08-04: qty 4

## 2013-08-04 MED ORDER — SODIUM CHLORIDE 0.9 % IV SOLN
80.0000 mg | Freq: Once | INTRAVENOUS | Status: DC
  Filled 2013-08-04: qty 80

## 2013-08-04 MED ORDER — SODIUM CHLORIDE 0.9 % IV SOLN: INTRAVENOUS | Status: DC

## 2013-08-04 MED ORDER — VANCOMYCIN HCL IN DEXTROSE 1-5 GM/200ML-% IV SOLN: 1000.0000 mg | INTRAVENOUS | Status: DC

## 2013-08-04 MED ORDER — FENTANYL CITRATE 0.05 MG/ML IJ SOLN
INTRAMUSCULAR | Status: AC
  Filled 2013-08-04: qty 4

## 2013-08-04 MED ORDER — PIPERACILLIN-TAZOBACTAM IN DEX 2-0.25 GM/50ML IV SOLN
2.2500 g | Freq: Four times a day (QID) | INTRAVENOUS | Status: DC
  Filled 2013-08-04 (×3): qty 50

## 2013-08-04 MED ORDER — BIOTENE DRY MOUTH MT LIQD: 1.0000 "application " | Freq: Four times a day (QID) | OROMUCOSAL | Status: DC

## 2013-08-04 MED ORDER — DOPAMINE-DEXTROSE 3.2-5 MG/ML-% IV SOLN
2.0000 ug/kg/min | INTRAVENOUS | Status: DC
Start: 2013-08-04 — End: 2013-08-04

## 2013-08-04 MED ORDER — ATROPINE SULFATE 1 MG/ML IJ SOLN
0.5000 mg | Freq: Once | INTRAMUSCULAR | Status: AC
  Administered 2013-08-04: 0.5 mg via INTRAVENOUS

## 2013-08-04 MED ORDER — SODIUM CHLORIDE 0.9 % IV BOLUS (SEPSIS)
1000.0000 mL | Freq: Once | INTRAVENOUS | Status: AC
  Administered 2013-08-04: 1000 mL via INTRAVENOUS

## 2013-08-04 MED ORDER — SODIUM CHLORIDE 0.9 % IV SOLN
8.0000 mg/h | INTRAVENOUS | Status: DC
  Filled 2013-08-04 (×2): qty 80

## 2013-08-04 MED ORDER — FENTANYL CITRATE 0.05 MG/ML IJ SOLN
25.0000 ug/h | INTRAMUSCULAR | Status: DC
  Filled 2013-08-04: qty 50

## 2013-08-04 MED ORDER — ALBUTEROL SULFATE (2.5 MG/3ML) 0.083% IN NEBU: 2.5000 mg | INHALATION_SOLUTION | RESPIRATORY_TRACT | Status: DC | PRN

## 2013-08-04 MED ORDER — PANTOPRAZOLE SODIUM 40 MG IV SOLR: 40.0000 mg | Freq: Two times a day (BID) | INTRAVENOUS | Status: DC

## 2013-08-04 MED ORDER — SODIUM BICARBONATE 8.4 % IV SOLN
INTRAVENOUS | Status: AC
  Filled 2013-08-04: qty 150

## 2013-08-04 MED ORDER — IOHEXOL 300 MG/ML  SOLN: 25.0000 mL | INTRAMUSCULAR | Status: AC

## 2013-08-04 MED ORDER — DOPAMINE-DEXTROSE 3.2-5 MG/ML-% IV SOLN
2.0000 ug/kg/min | INTRAVENOUS | Status: DC
  Filled 2013-08-04: qty 250

## 2013-08-04 MED ORDER — DOPAMINE-DEXTROSE 1.6-5 MG/ML-% IV SOLN
2.0000 ug/kg/min | INTRAVENOUS | Status: DC
Start: 2013-08-04 — End: 2013-08-04

## 2013-08-04 MED ORDER — CHLORHEXIDINE GLUCONATE 0.12 % MT SOLN
15.0000 mL | Freq: Two times a day (BID) | OROMUCOSAL | Status: DC
  Filled 2013-08-04: qty 15

## 2013-08-04 MED ORDER — ASPIRIN 325 MG PO TABS: 325.0000 mg | ORAL_TABLET | Freq: Every day | ORAL | Status: DC

## 2013-08-04 MED ORDER — SODIUM CHLORIDE 0.9 % IV BOLUS (SEPSIS)
500.0000 mL | Freq: Once | INTRAVENOUS | Status: AC
  Administered 2013-08-04: 500 mL via INTRAVENOUS

## 2013-08-04 MED ORDER — ATORVASTATIN CALCIUM 20 MG PO TABS: 20.0000 mg | ORAL_TABLET | Freq: Every day | ORAL | Status: DC

## 2013-08-06 LAB — URINE CULTURE

## 2013-08-07 MED FILL — Medication: Qty: 1 | Status: AC

## 2013-08-07 NOTE — Discharge Summary (Signed)
Name: Chloe Neal MRN: [id number] DOB: 22-Sep-1928  PCCM DEATH NOTE  Cause of death: Cardiogenic shock  Discharge diagnoses: Acute respiratory failure COPD without exacerbation Shock, likely cardiogenic Bradycardia STEMI Acute renal failure Hypovolemia Possible ischemic bowel Coumadin induced coagulopathy  Hyperglycemia Septic encephalopathy  Brief hospital course:  80 yo with past medical history of CAD, AF ( on Coumadin and Cardizem ) brought to ED with generalized weakness, diarrhea and LLQ abdominal pain. In ED bradycardic ( HR in 30's), in shock ( lactate 10), in acute renal failure ( Cr 1.9) Initially thought to be in hemorraghic shock as stool look dark and was hemoccult positive, however Hb came back 14. Nevertheless, she was given emergency blood and Protonix gtt was started. She was also noted to have ST elevations and Cardiology was consulted.  Course was complicated by rapidly evolving acute respiratory failure, shock, and several cardiac arrests.  The patient's condition, hospital course, ongoing treatment and prognosis were discussed with the family.  Questions were answered.  The consensus was reached that following patient's wishes no cardiopulmonary resuscitation should be attempted and comfort measures should be pursued.  Life support was withdrawn and comfort measures provided.  Patient expired shortly after.  No resuscitation was attempted.  Lonia Farber, MD Pulmonary and Critical Care Medicine [facility] Pager: [phone]

## 2013-08-08 LAB — TYPE AND SCREEN
ABO/RH(D): A POS
ANTIBODY SCREEN: NEGATIVE
UNIT DIVISION: 0
UNIT DIVISION: 0
Unit division: 0

## 2013-08-10 MED FILL — Atropine Sulfate Inj 0.1 MG/ML: INTRAMUSCULAR | Qty: 20 | Status: AC

## 2013-08-10 MED FILL — Sodium Bicarbonate IV Soln 8.4%: INTRAVENOUS | Qty: 100 | Status: AC

## 2013-08-10 MED FILL — Calcium Chloride Inj 10%: INTRAVENOUS | Qty: 10 | Status: AC

## 2013-09-03 NOTE — ED Notes (Signed)
Called [facility] per Dr. Ethelda Chick to only call the Inter[facility]. / no code stemi at this time.

## 2013-09-03 NOTE — Code Documentation (Signed)
  Patient Name: Chloe Neal   MRN: [id number]   Date of Birth/ Sex: 1928/08/07 , female      Admission Date: Aug 16, 2013  Attending Provider: Lonia Farber*  Primary Diagnosis: Shock   Indication: Pt was in the Medical ICU and was noted to brady down with HR 20's then no pulse noted by RN and Code blue was subsequently called  ~545pm. At the time of arrival on scene, ACLS protocol was underway.   Technical Description:  - CPR performance duration:  ~5-10 minutes  - Was defibrillation or cardioversion used? No   - Was external pacer placed? No  - Was patient intubated pre/post CPR? Yes   Medications Administered: Y = Yes; Blank = No Amiodarone    Atropine  Y  Calcium    Epinephrine  Y  Lidocaine    Magnesium    Norepinephrine    Phenylephrine    Sodium bicarbonate  Y  Vasopressin     Post CPR evaluation:  - Final Status - Was patient successfully resuscitated ? Yes - What is current rhythm? Sinus  - What is current hemodynamic status? hypertensive  Miscellaneous Information:  - Labs sent, including: None, ICU attending to take over  - Primary team notified?  Yes  - Family Notified? PER PRIMARY TEAM  - Additional notes/ transfer status: Code blue team and Dr. Marin Shutter by bedside. Dr. Marin Shutter to resume care.       Darden Palmer, MD  16-Aug-2013, 5:56 PM

## 2013-09-03 NOTE — Consult Note (Addendum)
Admit date: 08/24/2013 Referring Physician  Dr. Sherian Maroon Primary Physician  Dr. Robley Fries Primary Cardiologist  Eldridge Dace Reason for Consultation  Abnormal ECG  HPI: 80 year old woman who has atrial fibrillation. She has coronary artery disease. In 1994, she had an inferior MI and was treated with PTCA. Review of the visit that she had with her then primary care doctor in 2009, Dr. Boyd Kerbs, revealed that her anginal symptom was neck tightness. She has had a negative stress test in 2011. She had an echocardiogram in 2011 showing normal left ventricular function. Prior ECGs have shown intermittent left bundle branch block. She has had inferior Q waves with mild ST elevation.  She was brought to the hospital today by EMS. She was found on the floor of her apartment by a family member. She had some bloody diarrhea earlier this morning. Over the last few days, she had been feeling fine. She just felt poorly this morning. She felt weak and fell to the floor. She did not pass out. When she came to the emergency room, she was quite cold. She was hypotensive. Rectal exam showed frank blood and she was given a unit of O- blood as it was thought she may be having a GI bleed. Initial ECG showed a junctional escape rhythm with inferior ST elevation and lateral ST depression. There was a narrow QRS complex.  We are asked to further evaluate.  The patient denies any chest discomfort. Her only pain is in her left lower abdominal area. She has not had pain with eating. She has been taking her medicines as scheduled. She recently had a visit with our pharmacist regarding her Coumadin. She has been therapeutic.     PMH:   Past Medical History  Diagnosis Date  . Hypertension   . MI (myocardial infarction)   . GERD (gastroesophageal reflux disease)   . Shingles   . Hypercholesteremia   . Osteoporosis 03/19/2012    t score -2.8 forearm  . Atrial fibrillation   . Stroke      PSH:   Past Surgical History   Procedure Laterality Date  . Gallbladder surgery  1983  . Tonsillectomy  1952  . Angioplasty      TIA--CARDIOLOGIST IS DR. Camie Patience  . Dilation and curettage of uterus      D&E X 2 BACK IN THE 1950'S FOR MISSED AB  . Skin cancer excision      RIGHT LEG, LEFT FOOT    Allergies:  Review of patient's allergies indicates no known allergies. Prior to Admit Meds:   (Not in a hospital admission) Fam HX:    Family History  Problem Relation Age of Onset  . Breast cancer Sister 50  . Diabetes Daughter   . Heart disease Mother   . Stroke Mother   . Diabetes Mother   . Heart disease Father    Social HX:    History   Social History  . Marital Status: Widowed    Spouse Name: N/A    Number of Children: N/A  . Years of Education: N/A   Occupational History  . Not on file.   Social History Main Topics  . Smoking status: Current Every Day Smoker -- 0.50 packs/day for 50 years    Types: Cigarettes    Last Attempt to Quit: 10/21/2011  . Smokeless tobacco: Never Used  . Alcohol Use: No  . Drug Use: No  . Sexual Activity: No   Other Topics Concern  . Not on file  Social History Narrative  . No narrative on file     ROS:  All 11 ROS were addressed and are negative except what is stated in the HPI  Physical Exam: Blood pressure 102/65, pulse 95, temperature 95.3 F (35.2 C), temperature source Rectal, resp. rate 18, weight 141 lb (63.957 kg), SpO2 0.00%.   General: Pale, ill-appearing Head:   Normal cephalic and atramatic  Lungs:  No wheezing. Coarse breath sounds Heart:  Bradycardic, S1, S2 Abdomen: Mild left lower quadrant tenderness. No rebound, no guarding. Soft, nondistended  Extremities:  No edema.  DP +1 Neuro: Alert and oriented X 3. Psych:  Normal affect, responds appropriately    Labs:   Lab Results  Component Value Date   WBC 19.0* 08/05/2013   HGB 14.3 08/19/2013   HCT 42.0 09/02/2013   MCV 97.6 08/16/2013   PLT 122* 08/05/2013    Recent Labs Lab  08/05/2013 1525 09/02/2013 1532  NA 144 144  K 4.5 4.2  CL 107 112  CO2 14*  --   BUN 36* 35*  CREATININE 1.74* 1.90*  CALCIUM 9.5  --   PROT 5.7*  --   BILITOT 1.2  --   ALKPHOS 85  --   ALT 177*  --   AST 165*  --   GLUCOSE 124* 118*   No results found for this basename: PTT   Lab Results  Component Value Date   INR 2.29* 08/18/2013   INR 3.4 07/19/2013   INR 2.2 06/14/2013   No results found for this basename: CKTOTAL, CKMB, CKMBINDEX, TROPONINI     No results found for this basename: CHOL   No results found for this basename: HDL   No results found for this basename: LDLCALC   No results found for this basename: TRIG   No results found for this basename: CHOLHDL   No results found for this basename: LDLDIRECT      Radiology:  Dg Chest Portable 1 View  08/21/2013   CLINICAL DATA:  Myocardial infarction  EXAM: PORTABLE CHEST - 1 VIEW  COMPARISON:  None.  FINDINGS: Mild to moderate cardiac enlargement. Calcification of the aortic arch. Vascular pattern normal. Lungs clear.  IMPRESSION: Mild to moderate cardiac enlargement. No other significant findings.   Electronically Signed   By: Esperanza Heiraymond  Rubner M.D.   On: 08/03/2013 16:25    EKG:  Junctional rhythm with narrow complex QRS, inferior Q waves with inferior ST elevation. ST elevation in the anterior precordium. ST depression in the lateral leads.  ASSESSMENT: Rhythm abnormality with ECG suggestive of acute inferior and possible anterior injury pattern  PLAN:  I discussed the case in detail with Dr. Rennis ChrisJacobowitz and Dr. Blima DessertZubelevitsky.  During our discussion, several pieces of information came back. The patient is quite acidotic with a pH less than 7.2. Would also obtain a stat echocardiogram. This revealed normal left ventricular function without focal wall motion abnormalities.  1) coronary artery disease/abnormal ECG: Initially, I thought this may be an acute MI. Her echocardiogram does not show a focal wall motion  abnormality. She has several conditions which would create additional risk by going emergently to the Cath Lab. First of all, she has renal insufficiency with creatinine of 1.9. She also has a metabolic acidosis and has been somewhat hypotensive. This may increase the risk of contrast-induced nephropathy. She is already fully anticoagulated on Coumadin. This would increase her bleeding risk.  Repeat ECG when her heart rate was 60 showed that her ST segment  elevation was coming back towards baseline. It was not completely back to normal but significantly improved.  She also apparently has active lower GI bleeding. This would make giving heparin or any other type of anticoagulant during a catheterization problematic. We initially thought that her entire condition may have been from hypotension related to a GI bleed. However, her hemoglobin came back above 10. It may be falsely elevated and may drop if this is a sudden onset of a GI bleed, but this seems less likely. I was concerned that perhaps the GI bleeding was just a red herring and coronary ischemia did explain her clinical situation with hypotension and metabolic acidosis from a conduction abnormality and hypoperfusion. However, her white blood cell count came back quite elevated at 19,000. She has no symptoms like her prior angina. She potentially could have bowel ischemia causing the elevated lactic acid level, along with blood in her stool.  Elevated troponin is likely due to demand ischemia from hypotension and acidosis.  After much discussion and in consideration of all her comorbidities, along with the fact that she has normal left ventricular function without regional wall motion abnormality by stat echocardiogram, we decided that she would not go emergently for coronary angiography.  2) atrial fibrillation: She is currently in a junctional rhythm and occasionally will come back into a sinus bradycardia. Her heart rate has gone up to 60 but then  will drop back into the 30s. Hold diltiazem and other rate slowing agents. Hopefully, her heart rate will approve just as the medications wash out. He would not be unreasonable to place a transvenous pacer. She is currently on some dopamine IV. This may help her heart rate. She is maintaining a good blood pressure in able to mentate even with a slow heart rate. Since she is not going to the Cath Lab for coronary angiography, Dr. Blima Dessert will place a transvenous pacemaker if necessary.  3) review of the records from [facility] also shows that Dr. Sherin Quarry discovered that the patient did have diverticulosis in the past. This could be a cause of lower GI bleeding. Would also consider bowel ischemia as a possibility as well.  4) the patient is compensating from a respiratory standpoint for her metabolic acidosis. She is unlikely to be able to continue this for too long. She is going to be intubated to decrease her work of breathing.  90 minutes of critical care time were spent taking care of this patient. Corky Crafts., MD  08/25/2013  4:57 PM

## 2013-09-03 NOTE — ED Notes (Signed)
Emergency release blood started per Dr. Rennis Chris verbal order.

## 2013-09-03 NOTE — Consult Note (Signed)
Consultation  Referring Provider: Critical Care Medicine      Primary Care Physician:  Pearla DubonnetGATES,ROBERT NEVILL, MD Primary Gastroenterologist:  Formerly followed by [facility]iss[facility]. He has retired. [facility].        Reason for Consultation:  GI Bleed          HPI:   Elio ForgetDara Lea Mezquita is a 80 y.o. female brought into ED by EMS for weakness and bloody diarrhea. Most of the history comes from chart, patient on rebreather. She describes onset of LLQ pain this am. At some point following the onset of pain she began having hematochezia as well as nausea . Family found her on the floor. Upon arrival to ED patient was hypotensive, bradycardic.Troponin elevated 1.4, +ECG changes, lactic acid 9.9. Arterial pH 7.1. She is on chronic coumadin, for afib. INR 2.29. Hgb 14.3. Normal MCV. WBC at 19K. BNP 2346.   No acute findings on CXR. CTscan of abd/pelvis ordered.  She is in acute renal failure.    Past Medical History  Diagnosis Date  . Hypertension   . MI (myocardial infarction)   . GERD (gastroesophageal reflux disease)   . Shingles   . Hypercholesteremia   . Osteoporosis 03/19/2012    t score -2.8 forearm  . Atrial fibrillation   . Stroke     Past Surgical History  Procedure Laterality Date  . Gallbladder surgery  1983  . Tonsillectomy  1952  . Angioplasty      TIA--CARDIOLOGIST IS DR. Camie PatienceJAY VARNAFI  . Dilation and curettage of uterus      D&E X 2 BACK IN THE 1950'S FOR MISSED AB  . Skin cancer excision      RIGHT LEG, LEFT FOOT    Family History  Problem Relation Age of Onset  . Breast cancer Sister [id number]  . Diabetes Daughter   . Heart disease Mother   . Stroke Mother   . Diabetes Mother   . Heart disease Father      History  Substance Use Topics  . Smoking status: Current Every Day Smoker -- 0.50 packs/day for 50 years    Types: Cigarettes    Last Attempt to Quit: 10/21/2011  . Smokeless tobacco: Never Used  . Alcohol Use: No    Prior to Admission  medications   Medication Sig Start Date End Date Taking? Authorizing Provider  alendronate (FOSAMAX) 70 MG tablet Take 1 tablet (70 mg total) by mouth every 7 (seven) days. Take with a full glass of water on an empty stomach. 02/23/13   Kaitlen Lordsimothy P Fontaine, MD  atorvastatin (LIPITOR) 20 MG tablet Take 20 mg by mouth daily.    Historical Provider, MD  beta carotene w/minerals (OCUVITE) tablet Take 1 tablet by mouth daily.    Historical Provider, MD  Biotin 5000 MCG CAPS Take 1,000 mcg by mouth daily.     Historical Provider, MD  CALCIUM-VITAMIN D PO Take by mouth. 600MG  BID.     Historical Provider, MD  diltiazem (CARDIZEM CD) 300 MG 24 hr capsule Take 300 mg by mouth daily. 360mg     Historical Provider, MD  diltiazem (TIAZAC) 360 MG 24 hr capsule TAKE 1 CAPSULE ONCE A DAY ORALLY 05/31/13   Everette RankJay Varanasi, MD  furosemide (LASIX) 20 MG tablet Take 20 mg by mouth 2 (two) times daily.    Historical Provider, MD  losartan (COZAAR) 100 MG tablet Take 100 mg by mouth daily.  Historical Provider, MD  Multiple Vitamins-Minerals (OCUVITE EXTRA PO) Take by mouth.      Historical Provider, MD  nitroGLYCERIN (NITROSTAT) 0.4 MG SL tablet Place 0.4 mg under the tongue every 5 (five) minutes as needed for chest pain.    Historical Provider, MD  omeprazole (PRILOSEC) 20 MG capsule Take 20 mg by mouth daily.    Historical Provider, MD  OXYBUTYNIN CHLORIDE PO Take 10 mg by mouth daily.    Historical Provider, MD  POTASSIUM PO Take by mouth.    Historical Provider, MD  tiotropium (SPIRIVA) 18 MCG inhalation capsule Place 18 mcg into inhaler and inhale daily.    Historical Provider, MD  vitamin D, CHOLECALCIFEROL, 400 UNITS tablet Take 400 Units by mouth daily. 2000 units    Historical Provider, MD  warfarin (COUMADIN) 5 MG tablet Take 1 tablet (5 mg total) by mouth as directed. 08/01/13   Everette Rank, MD    Current Facility-Administered Medications  Medication Dose Route Frequency Provider Last Rate Last Dose  .  DOPamine (INTROPIN) 800 mg in dextrose 5 % 250 mL infusion  2-20 mcg/kg/min Intravenous Titrated Doug Sou, MD      . pantoprazole (PROTONIX) 80 mg in sodium chloride 0.9 % 100 mL IVPB  80 mg Intravenous Once Shanon Ace, MD      . pantoprazole (PROTONIX) 80 mg in sodium chloride 0.9 % 250 mL infusion  8 mg/hr Intravenous Continuous Shanon Ace, MD      . Melene Muller ON 08/08/2013] pantoprazole (PROTONIX) injection 40 mg  40 mg Intravenous Q12H Shanon Ace, MD       Current Outpatient Prescriptions  Medication Sig Dispense Refill  . alendronate (FOSAMAX) 70 MG tablet Take 1 tablet (70 mg total) by mouth every 7 (seven) days. Take with a full glass of water on an empty stomach.  12 tablet  3  . atorvastatin (LIPITOR) 20 MG tablet Take 20 mg by mouth daily.      . beta carotene w/minerals (OCUVITE) tablet Take 1 tablet by mouth daily.      . Biotin 5000 MCG CAPS Take 1,000 mcg by mouth daily.       Marland Kitchen CALCIUM-VITAMIN D PO Take by mouth. 600MG  BID.       Marland Kitchen diltiazem (CARDIZEM CD) 300 MG 24 hr capsule Take 300 mg by mouth daily. 360mg       . diltiazem (TIAZAC) 360 MG 24 hr capsule TAKE 1 CAPSULE ONCE A DAY ORALLY  90 capsule  1  . furosemide (LASIX) 20 MG tablet Take 20 mg by mouth 2 (two) times daily.      Marland Kitchen losartan (COZAAR) 100 MG tablet Take 100 mg by mouth daily.        . Multiple Vitamins-Minerals (OCUVITE EXTRA PO) Take by mouth.        . nitroGLYCERIN (NITROSTAT) 0.4 MG SL tablet Place 0.4 mg under the tongue every 5 (five) minutes as needed for chest pain.      Marland Kitchen omeprazole (PRILOSEC) 20 MG capsule Take 20 mg by mouth daily.      . OXYBUTYNIN CHLORIDE PO Take 10 mg by mouth daily.      Marland Kitchen POTASSIUM PO Take by mouth.      . tiotropium (SPIRIVA) 18 MCG inhalation capsule Place 18 mcg into inhaler and inhale daily.      . vitamin D, CHOLECALCIFEROL, 400 UNITS tablet Take 400 Units by mouth daily. 2000 units      . warfarin (COUMADIN) 5 MG tablet Take  1 tablet (5 mg total) by  mouth as directed.  70 tablet  1    Allergies as of 08-31-13  . (No Known Allergies)   Review of Systems:    Limited ROS, patient on rebreather. All systems reviewed and negative except where noted in HPI.   Physical Exam:  Vital signs in last 24 hours: Temp:  [95.3 F (35.2 C)] 95.3 F (35.2 C) (01/02 1540) Pulse Rate:  [42-47] 42 (01/02 1545) Resp:  [13-19] 19 (01/02 1610) BP: (77-133)/(42-51) 106/51 mmHg (01/02 1610) Weight:  [141 lb (63.957 kg)] 141 lb (63.957 kg) (01/02 1557)   General:   Pleasant female in NAD Head:  Normocephalic and atraumatic. Eyes:   No icterus.   Conjunctiva pink. Ears:  Normal auditory acuity. Neck:  Supple; no masses felt Lungs:  Respirations even and unlabored. Heart:  Bradycardic, heartrate in 30's.  Abdomen:  Soft, nondistended, moderate LLQ tenderness.  Normal bowel sounds. No appreciable masses or hepatomegaly.  Rectal:  Can't do at this moment, RT getting blood gases. There was frank reddish blood in ED staffs rectal exam.  Msk:  Symmetrical without gross deformities.  Extremities:  Without edema. Neurologic:  Alert and  oriented x4;  grossly normal neurologically. Skin:  Intact without significant lesions or rashes. Cervical Nodes:  No significant cervical adenopathy. Psych:  Alert and cooperative. Normal affect.  LAB RESULTS:  Recent Labs  08/31/13 1525 2013/08/31 1532  WBC 19.0*  --   HGB 13.7 14.3  HCT 40.9 42.0  PLT 122*  --    BMET  Recent Labs  31-Aug-2013 1532  NA 144  K 4.2  CL 112  GLUCOSE 118*  BUN 35*  CREATININE 1.90*   PT/INR  Recent Labs  August 31, 2013 1525  LABPROT 24.5*  INR 2.29*    PREVIOUS ENDOSCOPIES:            colonoscopy greater than 5 years ago by [facility]. Results?   Impression / Plan:   45. 80 year old critically ill female presenting with lactic acidosis, renal failure, hypotension, leukocytosis and probable MI. Patient had acute onset of LLQ pain and hematochezia this am. Doesn't seem likely  that shock is hemorrhagic in nature. Hgb 14.3. While hgb likely falsely elevated from hemoconcentration, hemorrhagic shock seems less likely. Maybe septic or cardiogenic shock. She is on Dopamine , has received a unit of blood emergently. Patient is anti-coagulated, reversing INR in setting of coronary event may not be possible. Suspect hematochezia may be secondary to ischemic colitis. Will follow closely. Supportive care for now.    2. 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   3. Diverticulosis by CTscan last year.   Thanks   LOS: 0 days   Willette Cluster  Aug 31, 2013, 4:12 PM    Agree with Ms. Guenther's assessment and plan. Iva Boop, MD, [facility]  Note - patient seen earlier - has since expired.  Iva Boop, MD, [facility] Gastroenterology [phone] (pager) 2013/08/31 8:34 PM

## 2013-09-03 NOTE — Procedures (Signed)
Name:  [name] MRN:  [id number] DOB:  11/06/1928  PROCEDURE NOTE  Procedure:  Ultrasound-guided central venous catheter placement.  Indications:  Need for intravenous access and hemodynamic monitoring.  Consent:  Consent was implied due to the emergency nature of the procedure.  Anesthesia:  A total of 10 mL of 1% Lidocaine was used for local infiltration anesthesia.  Procedure summary:  Appropriate equipment was assembled.  The patient was identified as Chloe Neal and safety timeout was performed. The patient was placed in Trendelenburg position.  Sterile technique was used. The patient's right neck region was prepped using chlorhexidine / alcohol scrub and the field was draped in usual sterile fashion with full body drape. The right internal jugular vein and the right carotid artery were identified by ultrasound, the patency was evaluated and images were documented. After the adequate anesthesia was achieved, the vein was cannulated with the introducer needle under sonographic guidance without difficulty. A guide wire was advanced through the introducer needle, which was then withdrawn. A small skin incision was made at the point of wire entry, the dilator was inserted over the guide wire and appropriate dilation was obtained. The dilator was removed and triple-lumen catheter was advanced over the guide wire, which was then removed.  All ports were aspirated and flushed with normal saline without difficulty. The catheter was secured into place with sutures. Antibiotic patch was placed and sterile dressing was applied. Post-procedure chest x-ray was ordered.  Complications:  No immediate complications were noted.  Hemodynamic parameters and oxygenation remained stable throughout the procedure.  Estimated blood loss:  Less then 5 mL.  Orlean Bradford, M.D. Pulmonary and Critical Care Medicine [facility] Pager: [phone]  08/27/2013, 8:11 PM

## 2013-09-03 NOTE — Progress Notes (Signed)
Patient arrived to unit alert and oriented on non rebreather with heart rate in 30's. Patient atached to zole monitor and CCM MD here to see patient. Patient intubated with 20mg  of etomidate, of fentanyl, and 2mg  versed. Tolerated procedure well. Within 5 minutes, patient brady with heart rate of 26 and no pulse. CPR started, MD notified, code called. Dope amine started in left AC peripheral IV at 5 mcg. Increased to 10 and eventually to 20. Patient regained pulse however coded again shortly after central line placement. Patient went asystole for the second time and again CPR started, MD in room giving orders. Aline placed by MD. Family notified and updated. Family expressed wishes to make patient DNR. Patient again regained pulse. Family expresses wishes to withdrawal care and allow patient to pass peacefully. Chaplain with family in room. No needs at this time. Respiratory given order to extubate patient and comfort care orders carried out by this RN.   Wyn Quaker RN

## 2013-09-03 NOTE — Progress Notes (Addendum)
ANTIBIOTIC CONSULT NOTE - INITIAL  Pharmacy Consult for vanc/zosyn/heparin Indication: rule out sepsis/r/o MI  No Known Allergies  Patient Measurements: Weight: 141 lb (63.957 kg) Adjusted Body Weight:   Vital Signs: Temp: 95.3 F (35.2 C) (01/02 1540) Temp src: Rectal (01/02 1540) BP: 102/65 mmHg (01/02 1615) Pulse Rate: 95 (01/02 1555) Intake/Output from previous day:   Intake/Output from this shift:    Labs:  Recent Labs  08/15/2013 1525 08/05/2013 1532  WBC 19.0*  --   HGB 13.7 14.3  PLT 122*  --   CREATININE 1.74* 1.90*   The CrCl is unknown because both a height and weight (above a minimum accepted value) are required for this calculation. No results found for this basename: VANCOTROUGH, VANCOPEAK, VANCORANDOM, GENTTROUGH, GENTPEAK, GENTRANDOM, TOBRATROUGH, TOBRAPEAK, TOBRARND, AMIKACINPEAK, AMIKACINTROU, AMIKACIN,  in the last 72 hours   Microbiology: No results found for this or any previous visit (from the past 720 hour(s)).  Medical History: Past Medical History  Diagnosis Date  . Hypertension   . MI (myocardial infarction)   . GERD (gastroesophageal reflux disease)   . Shingles   . Hypercholesteremia   . Osteoporosis 03/19/2012    t score -2.8 forearm  . Atrial fibrillation   . Stroke     Medications:   (Not in a hospital admission) Scheduled:  . aspirin  325 mg Oral Daily  . pantoprazole (PROTONIX) IV  40 mg Intravenous Q12H   Assessment:  80 yo who was admitted for weakness and diarrhea. He is being on vanc/zosyn to r/o sepsis. Pt is also on coumadin for afib. INR is currently therapeutic. D/w Dr. Herma Carson tonight, will start heparin when INR <2  Goal of Therapy:  Vancomycin trough level 15-20 mcg/ml  Plan:   Vancomycin 1g IV q24 Zosyn 2.25g IV q6 Will get trough if needed Heparin when INR <2

## 2013-09-03 NOTE — Progress Notes (Signed)
Chaplain responded to code blue.  Pt was resuscitated twice.  Chaplain offered emotional support pt's family-two daughters and a son-in-law-through his presence, compassion, empathic listening.  Pt and family's pastor arrived.  Chaplain will follow up as necessary.

## 2013-09-03 NOTE — ED Provider Notes (Signed)
Presents with diarrhea for one day, lightheadedness, found on floor by her family member. Treated by EMS with peripheral IV. Found to be bradycardic with heart rate of 30's in the field. On exam patient is ill-appearing he gets memories pale heart bradycardic regular rhythm lungs clear auscultation abdomen nondistended, tender at left lower quadrant. Rectum normal tone gross blood heme positive. Patient placed on cardiac monitor patient to be given O- blood. Intravenous fluids. EKG consistent with acute inferior wall stemi however code stemming I called in light of acute GI bleed. I spoke with cardiology who come to evaluate patient. We'll also consult critical care medicine and gastroenterology. Patient's blood pressure and pulse improved after treatment with O- blood, intravenous fluids. We also consult She will be admitted to the intensive care unit. Intensivist will check on abdominal CT scan. We are concerned about bowel ischemia versus diverticular disease Results for orders placed during the hospital encounter of 08/20/2013  PROTIME-INR      Result Value Range   Prothrombin Time 24.5 (*) 11.6 - 15.2 seconds   INR 2.29 (*) 0.00 - 1.49  CBC WITH DIFFERENTIAL      Result Value Range   WBC 19.0 (*) 4.0 - 10.5 K/uL   RBC 4.19  3.87 - 5.11 MIL/uL   Hemoglobin 13.7  12.0 - 15.0 g/dL   HCT 16.1  09.6 - 04.5 %   MCV 97.6  78.0 - 100.0 fL   MCH 32.7  26.0 - 34.0 pg   MCHC 33.5  30.0 - 36.0 g/dL   RDW 40.9  81.1 - 91.4 %   Platelets 122 (*) 150 - 400 K/uL   Neutrophils Relative % 86 (*) 43 - 77 %   Neutro Abs 16.4 (*) 1.7 - 7.7 K/uL   Lymphocytes Relative 6 (*) 12 - 46 %   Lymphs Abs 1.1  0.7 - 4.0 K/uL   Monocytes Relative 8  3 - 12 %   Monocytes Absolute 1.5 (*) 0.1 - 1.0 K/uL   Eosinophils Relative 0  0 - 5 %   Eosinophils Absolute 0.0  0.0 - 0.7 K/uL   Basophils Relative 0  0 - 1 %   Basophils Absolute 0.0  0.0 - 0.1 K/uL  COMPREHENSIVE METABOLIC PANEL      Result Value Range   Sodium 144   137 - 147 mEq/L   Potassium 4.5  3.7 - 5.3 mEq/L   Chloride 107  96 - 112 mEq/L   CO2 14 (*) 19 - 32 mEq/L   Glucose, Bld 124 (*) 70 - 99 mg/dL   BUN 36 (*) 6 - 23 mg/dL   Creatinine, Ser 7.82 (*) 0.50 - 1.10 mg/dL   Calcium 9.5  8.4 - 95.6 mg/dL   Total Protein 5.7 (*) 6.0 - 8.3 g/dL   Albumin 3.1 (*) 3.5 - 5.2 g/dL   AST 213 (*) 0 - 37 U/L   ALT 177 (*) 0 - 35 U/L   Alkaline Phosphatase 85  39 - 117 U/L   Total Bilirubin 1.2  0.3 - 1.2 mg/dL   GFR calc non Af Amer 26 (*) >90 mL/min   GFR calc Af Amer 30 (*) >90 mL/min  PRO B NATRIURETIC PEPTIDE      Result Value Range   Pro B Natriuretic peptide (BNP) 2346.0 (*) 0 - 450 pg/mL  LIPASE, BLOOD      Result Value Range   Lipase 35  11 - 59 U/L  GLUCOSE, CAPILLARY  Result Value Range   Glucose-Capillary 131 (*) 70 - 99 mg/dL   Comment 1 Notify RN     Comment 2 Documented in Chart    OCCULT BLOOD, POC DEVICE      Result Value Range   Fecal Occult Bld POSITIVE (*) NEGATIVE  POCT I-STAT, CHEM 8      Result Value Range   Sodium 144  137 - 147 mEq/L   Potassium 4.2  3.7 - 5.3 mEq/L   Chloride 112  96 - 112 mEq/L   BUN 35 (*) 6 - 23 mg/dL   Creatinine, Ser 1.611.90 (*) 0.50 - 1.10 mg/dL   Glucose, Bld 096118 (*) 70 - 99 mg/dL   Calcium, Ion 0.451.23  4.091.13 - 1.30 mmol/L   TCO2 15  0 - 100 mmol/L   Hemoglobin 14.3  12.0 - 15.0 g/dL   HCT 81.142.0  91.436.0 - 78.246.0 %  CG4 I-STAT (LACTIC ACID)      Result Value Range   Lactic Acid, Venous 9.99 (*) 0.5 - 2.2 mmol/L  POCT I-STAT TROPONIN I      Result Value Range   Troponin i, poc 1.43 (*) 0.00 - 0.08 ng/mL   Comment NOTIFIED PHYSICIAN     Comment 3           POCT I-STAT 3, BLOOD GAS (G3+)      Result Value Range   pH, Arterial 7.173 (*) 7.350 - 7.450   pCO2 arterial 30.2 (*) 35.0 - 45.0 mmHg   pO2, Arterial 281.0 (*) 80.0 - 100.0 mmHg   Bicarbonate 11.1 (*) 20.0 - 24.0 mEq/L   TCO2 12  0 - 100 mmol/L   O2 Saturation 100.0     Acid-base deficit 16.0 (*) 0.0 - 2.0 mmol/L   Collection site  RADIAL, ALLEN'S TEST ACCEPTABLE     Drawn by Operator     Sample type ARTERIAL     Comment NOTIFIED PHYSICIAN    TYPE AND SCREEN      Result Value Range   ABO/RH(D) A POS     Antibody Screen NEG     Sample Expiration 08/07/2013    TYPE AND SCREEN      Result Value Range   ABO/RH(D) A POS     Antibody Screen NEG     Sample Expiration 08/07/2013     Unit Number [id number]     Blood Component Type RED CELLS,LR     Unit division 00     Status of Unit ISSUED     Unit tag comment VERBAL ORDERS PER DR Beni Turrell     Transfusion Status OK TO TRANSFUSE     Crossmatch Result PENDING     Unit Number [id number]     Blood Component Type RED CELLS,LR     Unit division 00     Status of Unit ALLOCATED     Transfusion Status OK TO TRANSFUSE     Crossmatch Result Compatible     Unit Number [id number]     Blood Component Type RED CELLS,LR     Unit division 00     Status of Unit ALLOCATED     Transfusion Status OK TO TRANSFUSE     Crossmatch Result Compatible    ABO/RH      Result Value Range   ABO/RH(D) A POS     Dg Chest Portable 1 View  08/10/2013   CLINICAL DATA:  Myocardial infarction  EXAM: PORTABLE CHEST - 1 VIEW  COMPARISON:  None.  FINDINGS:  Mild to moderate cardiac enlargement. Calcification of the aortic arch. Vascular pattern normal. Lungs clear.  IMPRESSION: Mild to moderate cardiac enlargement. No other significant findings.   Electronically Signed   By: Esperanza Heir M.D.   On: 08/19/2013 16:25    Diagnosis #1 shock differential diagnosis includes cardiogenic versus hemorrhagic #2 acute gastrointestinal bleed  #3 metabolic acidosis #4 acute STEMI #5 renal insufficiency CRITICAL CARE Performed by: Doug Sou Total critical care time: 60 minute Critical care time was exclusive of separately billable procedures and treating other patients. Critical care was necessary to treat or prevent imminent or life-threatening deterioration. Critical care was time spent  personally by me on the following activities: development of treatment plan with patient and/or surrogate as well as nursing, discussions with consultants, evaluation of patient's response to treatment, examination of patient, obtaining history from patient or surrogate, ordering and performing treatments and interventions, ordering and review of laboratory studies, ordering and review of radiographic studies, pulse oximetry and re-evaluation of patient's condition.  Doug Sou, MD 08/20/2013 [phone]

## 2013-09-03 NOTE — Progress Notes (Signed)
End tidal Co2 attached to pt during CODE.  Reading CO2 of 18.

## 2013-09-03 NOTE — Progress Notes (Signed)
Discuss recent events, condition and plan of care with family.  DNR / DNI.  No escalation of care.  Likely withdrawal of life sustaining treatment to comfort measures.  Lonia Farber, MD Pulmonary and Critical Care Medicine [facility] Pager: [phone]

## 2013-09-03 NOTE — ED Provider Notes (Signed)
I have personally seen and examined the patient.  I have discussed the plan of care with the resident.  I have reviewed the documentation on PMH/FH/Soc. History.  I have reviewed the documentation of the resident and agree.  Doug Sou, MD 08/05/13 0111

## 2013-09-03 NOTE — ED Notes (Signed)
Paged CC and GI per DR Blanche East

## 2013-09-03 NOTE — Code Documentation (Signed)
  Patient Name: Chloe Neal   MRN: [id number]   Date of Birth/ Sex: 1928/10/15 , female      Admission Date: 08-08-13  Attending Provider: Lonia Farber*  Primary Diagnosis: Shock   Indication: Pt was s/p code who was revived earlier this evening and then went back in to asystole when code blue was subsequently called again. At the time of arrival on scene, ACLS protocol was underway.   Technical Description:  - CPR performance duration:  ~5-10 minutes  - Was defibrillation or cardioversion used? Yes   - Was external pacer placed? No  - Was patient intubated pre/post CPR? Yes   Medications Administered: Y = Yes; Blank = No Amiodarone  y  Atropine    Calcium  y  Epinephrine  y  Lidocaine    Magnesium    Norepinephrine    Phenylephrine    Sodium bicarbonate  y  Vasopressin     Post CPR evaluation:  - Final Status - Was patient successfully resuscitated ? Yes - What is current rhythm? Sinus 3 - What is current hemodynamic status? Hypertensive  Miscellaneous Information:  - Labs sent, including: None, ICU attending bedside  - Primary team notified?  Yes  - Family Notified? Yes  - Additional notes/ transfer status: Dr. Marin Shutter already in room and running code. I spoke to family who have decided to make patient DNR at this time in the event that she has cardiac arrest again. ICU will continue care and discuss case and goals of care further with family.     Darden Palmer, MD  08/08/2013, 6:37 PM

## 2013-09-03 NOTE — Procedures (Signed)
Name: Chloe Neal MRN: [id number] DOB: [birth year]   PROCEDURE NOTE  Procedure:  Arterial catheter placement.  Indications:  Need for invasive hemodynamic monitoring / frequent arterial blood gases measurement.  Consent:  Consent was implied due to the emergency nature of the procedure.  Procedure summary:  The patient was identified as Chloe Neal and safety timeout was performed. Sterile technique was used. The patient's right groin was prepped using chlorhexidine / alcohol scrub and the field was draped in usual sterile fashion with protective barrier. The right femoral artery was cannulated without difficulty. Blood was aspirated and the catheter was flushed with normal saline without difficulty. Good arterial waveform was obtained. The catheter was secured into place with sterile dressing.  Complications:  No immediate complications were noted.  Estimated blood loss:  Less then 5 mL  Orlean Bradford, M.D. Pulmonary and Critical Care Medicine [facility] Cell: [phone] Pager: [phone]  Aug 23, 2013, 8:12 PM

## 2013-09-03 NOTE — Procedures (Signed)
Name: Raylynn Meuser MRN: [id number] DOB: [birth year]   PROCEDURE NOTE  Procedure:  Endotracheal intubation.  Indication:  Acute respiratory failure  Consent:  Consent was implied due to the emergency nature of the procedure.  Anesthesia:  A total of 10 mg of Etomidate was given intravenously.  Procedure summary:  Appropriate equipment was assembled. The patient was identified as Elio Forget and safety timeout was performed. The patient was placed supine, with head in sniffing position. After adequate level of anesthesia was achieved, a GS#3 blade was inserted into the oropharynx and the vocal cords were visualized. A 7.5 endotracheal tube was inserted without difficulty and visualized going through the vocal cords. The stylette was removed and cuff inflated. Colorimetric change was noted on the CO2 meter. Breath sounds were heard over both lung fields equally. ETT was secured at 22 cm lip line.  Post procedure chest xray was ordered.  Complications:  No immediate complications were noted.  Hemodynamic parameters and oxygenation remained stable throughout the procedure.    Orlean Bradford, M.D. Pulmonary and Critical Care Medicine [facility] Pager: [phone]  Sep 02, 2013, 8:10 PM

## 2013-09-03 NOTE — ED Notes (Signed)
Radiology at bedside for portable chest.

## 2013-09-03 NOTE — ED Notes (Signed)
Pt arrives [facility] from home for "generalized weakness and diarrhea." Family found patient on floor in her home.

## 2013-09-03 NOTE — H&P (Signed)
PULMONARY / CRITICAL CARE MEDICINE  Name: Chloe Neal MRN: [id number] DOB: Jun 16, 1929    ADMISSION DATE:  08/08/2013 CONSULTATION DATE:  08/10/2013  REFERRING MD :  EDP PRIMARY SERVICE:  PCCM  CHIEF COMPLAINT:  Shock  BRIEF PATIENT DESCRIPTION: 80 yo with past medical history of CAD, AF ( on Coumadin and Cardizem ) brought to ED with generalized weakness, diarrhea and LLQ abdominal pain.  In ED bradycardic ( HR in 30's), in shock ( lactate 10), in acute renal failure ( Cr 1.9)  Initially thought to be in hemorraghic shock as stool look dark and was hemoccult positive, however Hb came back 14.  Nevertheless, she was given emergency blood and Protonix gtt was started.  She was also noted to have ST elevations and Cardiology was consulted.  SIGNIFICANT EVENTS / STUDIES:  1/2  CT abdomen / pelvis >>> 1/2  TTE >>>  LINES / TUBES: ETT 1/2 >>> OGT 1/2 >>> Foley 1/2 >>> R IJ CVL 1/2 >>> R fem A-line >>>  CULTURES: 1/2  Blood >>> 1/2  Urine >>>  ANTIBIOTICS: Zosyn 1/2 >>> Vancomycin 1/2 >>>  The patient is encephalopathic and unable to provide history, which was obtained for available medical records.  HISTORY OF PRESENT ILLNESS:  80 yo with past medical history of CAD, AF ( on Coumadin and Cardizem ) brought to ED with generalized weakness, diarrhea and LLQ abdominal pain.  In ED bradycardic ( HR in 30's), in shock ( lactate 10), in acute renal failure ( Cr 1.9)  Initially thought to be in hemorraghic shock as stool look dark and was hemoccult positive, however Hb came back 14.  Nevertheless, she was given emergency blood and Protonix gtt was started.  She was also noted to have ST elevations and Cardiology was consulted.  PAST MEDICAL HISTORY :  Past Medical History  Diagnosis Date  . Hypertension   . MI (myocardial infarction)   . GERD (gastroesophageal reflux disease)   . Shingles   . Hypercholesteremia   . Osteoporosis 03/19/2012    t score -2.8 forearm  . Atrial  fibrillation   . Stroke    Past Surgical History  Procedure Laterality Date  . Gallbladder surgery  1983  . Tonsillectomy  1952  . Angioplasty      TIA--CARDIOLOGIST IS DR. Camie PatienceJAY VARNAFI  . Dilation and curettage of uterus      D&E X 2 BACK IN THE 1950'S FOR MISSED AB  . Skin cancer excision      RIGHT LEG, LEFT FOOT   Prior to Admission medications   Medication Sig Start Date End Date Taking? Authorizing Provider  alendronate (FOSAMAX) 70 MG tablet Take 1 tablet (70 mg total) by mouth every 7 (seven) days. Take with a full glass of water on an empty stomach. 02/23/13   Mithra Lordsimothy P Fontaine, MD  atorvastatin (LIPITOR) 20 MG tablet Take 20 mg by mouth daily.    Historical Provider, MD  beta carotene w/minerals (OCUVITE) tablet Take 1 tablet by mouth daily.    Historical Provider, MD  Biotin 5000 MCG CAPS Take 1,000 mcg by mouth daily.     Historical Provider, MD  CALCIUM-VITAMIN D PO Take by mouth. 600MG  BID.     Historical Provider, MD  diltiazem (CARDIZEM CD) 300 MG 24 hr capsule Take 300 mg by mouth daily. 360mg     Historical Provider, MD  diltiazem (TIAZAC) 360 MG 24 hr capsule TAKE 1 CAPSULE ONCE A DAY ORALLY 05/31/13   Everette RankJay Varanasi,  MD  furosemide (LASIX) 20 MG tablet Take 20 mg by mouth 2 (two) times daily.    Historical Provider, MD  losartan (COZAAR) 100 MG tablet Take 100 mg by mouth daily.      Historical Provider, MD  Multiple Vitamins-Minerals (OCUVITE EXTRA PO) Take by mouth.      Historical Provider, MD  nitroGLYCERIN (NITROSTAT) 0.4 MG SL tablet Place 0.4 mg under the tongue every 5 (five) minutes as needed for chest pain.    Historical Provider, MD  omeprazole (PRILOSEC) 20 MG capsule Take 20 mg by mouth daily.    Historical Provider, MD  OXYBUTYNIN CHLORIDE PO Take 10 mg by mouth daily.    Historical Provider, MD  POTASSIUM PO Take by mouth.    Historical Provider, MD  tiotropium (SPIRIVA) 18 MCG inhalation capsule Place 18 mcg into inhaler and inhale daily.    Historical  Provider, MD  vitamin D, CHOLECALCIFEROL, 400 UNITS tablet Take 400 Units by mouth daily. 2000 units    Historical Provider, MD  warfarin (COUMADIN) 5 MG tablet Take 1 tablet (5 mg total) by mouth as directed. 08/01/13   Everette Rank, MD   No Known Allergies  FAMILY HISTORY:  Family History  Problem Relation Age of Onset  . Breast cancer Sister 53  . Diabetes Daughter   . Heart disease Mother   . Stroke Mother   . Diabetes Mother   . Heart disease Father    SOCIAL HISTORY:  reports that she has been smoking Cigarettes.  She has a 25 pack-year smoking history. She has never used smokeless tobacco. She reports that she does not drink alcohol or use illicit drugs.  REVIEW OF SYSTEMS:  Unable to provide.  INTERVAL HISTORY:  VITAL SIGNS: Temp:  [95.3 F (35.2 C)] 95.3 F (35.2 C) (01/02 1540) Pulse Rate:  [42-95] 95 (01/02 1555) Resp:  [13-25] 18 (01/02 1615) BP: (77-133)/(42-65) 102/65 mmHg (01/02 1615) Weight:  [63.957 kg (141 lb)] 63.957 kg (141 lb) (01/02 1557)  HEMODYNAMICS:   VENTILATOR SETTINGS:   INTAKE / OUTPUT: Intake/Output   None     PHYSICAL EXAMINATION: General:  Appears acutely ill Neuro:  Somnolent, but protects airway, cannot provide consistent history HEENT:  PERRL Cardiovascular:  Irregular, bradycardic, no murmurs Lungs:  Bilateral diminished air entry, no w/r/r Abdomen:  Soft, tenderness LLQ, no rebounds Musculoskeletal:  Moves all extremities, no edema Skin:  Intact  LABS: CBC  Recent Labs Lab 08/13/2013 1525 08/30/2013 1532  WBC 19.0*  --   HGB 13.7 14.3  HCT 40.9 42.0  PLT 122*  --    Coag's  Recent Labs Lab 08/03/2013 1525  INR 2.29*   BMET  Recent Labs Lab 08/09/2013 1525 08/12/2013 1532  NA 144 144  K 4.5 4.2  CL 107 112  CO2 14*  --   BUN 36* 35*  CREATININE 1.74* 1.90*  GLUCOSE 124* 118*   Electrolytes  Recent Labs Lab 09/01/2013 1525  CALCIUM 9.5   Sepsis Markers  Recent Labs Lab 08/31/2013 1533  LATICACIDVEN  9.99*   ABG No results found for this basename: PHART, PCO2ART, PO2ART,  in the last 168 hours Liver Enzymes  Recent Labs Lab 08/28/2013 1525  AST 165*  ALT 177*  ALKPHOS 85  BILITOT 1.2  ALBUMIN 3.1*   Cardiac Enzymes  Recent Labs Lab 08/21/2013 1530  PROBNP 2346.0*   Glucose  Recent Labs Lab 08/05/2013 1518  GLUCAP 131*   CXR:  1/2 >>> No overt airspace disease  ASSESSMENT / PLAN:  PULMONARY A:   Acute respiratory failure in setting of shock. Suspect h/o COPD ( on Spiriva preadmission ). P:   Goal pH>7.30, SpO2>92 Continuous mechanical support, high Ve VAP bundle Daily SBT Trend ABG/CXR Albuterol PRN  CARDIOVASCULAR A:  Shock ( cardiogenic, hypovolemic, septic ). Bradycardia. STEMI vs demand ischemia. P:  Goal MAP>60 Trend troponin / lactate TTE Dopamine gtt May need transvenous pacer Discussed with Cardiology - no cath lab for now ASA, Lipitor Hold Lipitor - elevated transaminases Hold Cardizem - bradycardia Hold Losartan - acute renal failire  RENAL A:   Acute renal failure. Hypovolemia. P:   Goal CVP>10 Trend BMP NS 1000 x 1 NS@150  CK, Myoglobbin  GASTROINTESTINAL A:   Suspected GI hemorrhage initially, but normal Hb. Suspicion for ischemic bowel given abdominal pain and high lactate.  GI Px P:   NPO as intubated Protonix for GI Px LFT CT abdomen / pelvis  HEMATOLOGIC A:   Coumadin induced coagulopathy. Anticoagulation for ACS. VTE Px is not indicated. P:  Trend CBC Hold Coumadin Heparin gtt when INR<2  INFECTIOUS A:  Suspected intraabdominal sepsis. P:   Cultures and antibiotics as above PCT  ENDOCRINE  A: Hyperglycemia.  P:   SSI  NEUROLOGIC A:  Septic encephalopathy. P:   Goal RASS 0 to -1 Daily WUA Fentanyl gtt Versed PRN  I have personally obtained history, examined patient, evaluated and interpreted laboratory and imaging results, reviewed medical records, formulated assessment / plan and placed  orders.  CRITICAL CARE:  The patient is critically ill with multiple organ systems failure and requires high complexity decision making for assessment and support, frequent evaluation and titration of therapies, application of advanced monitoring technologies and extensive interpretation of multiple databases. Critical Care Time devoted to patient care services described in this note is 60 minutes.   Lonia Farber, MD Pulmonary and Critical Care Medicine [facility] Pager: [phone]  2013-09-02, 4:23 PM

## 2013-09-03 NOTE — ED Provider Notes (Signed)
CSN: [id number]     Arrival date & time 05-Aug-2013  1505 History   First MD Initiated Contact with Patient 08-05-13 1512     Chief Complaint  Patient presents with  . Weakness  . Diarrhea    HPI  Presents via EMS for cc of weakness, diarrhea and pre-syncope. Was found by EMS to be bradycardic. The patient states she was in her usual state of health today at which point she began to have diarrhea. She states that she has had some mild associated vomiting that she reports as phlegm. Family found the patient down on the ground. She denies any fall or injury from the fall. She states she has had mild associated left lower quadrant pain as well. Currently she is complaining of only LLQ abdominal pain.   Past Medical History  Diagnosis Date  . Hypertension   . MI (myocardial infarction)   . GERD (gastroesophageal reflux disease)   . Shingles   . Hypercholesteremia   . Osteoporosis 03/19/2012    t score -2.8 forearm  . Atrial fibrillation   . Stroke   . Coronary atherosclerosis of native coronary artery   . Old myocardial infarction    Past Surgical History  Procedure Laterality Date  . Gallbladder surgery  1983  . Tonsillectomy  1952  . Angioplasty      TIA--CARDIOLOGIST IS DR. Camie Patience  . Dilation and curettage of uterus      D&E X 2 BACK IN THE 1950'S FOR MISSED AB  . Skin cancer excision      RIGHT LEG, LEFT FOOT   Family History  Problem Relation Age of Onset  . Breast cancer Sister 72  . Diabetes Daughter   . Heart disease Mother   . Stroke Mother   . Diabetes Mother   . Heart disease Father    History  Substance Use Topics  . Smoking status: Current Every Day Smoker -- 0.50 packs/day for 50 years    Types: Cigarettes    Last Attempt to Quit: 10/21/2011  . Smokeless tobacco: Never Used  . Alcohol Use: No   OB History   Grav Para Term Preterm Abortions TAB SAB Ect Mult Living   4 2   2  2   2      Review of Systems  Constitutional: Negative for fever and chills.   Respiratory: Negative for shortness of breath.   Cardiovascular: Negative for chest pain.  Gastrointestinal: Positive for nausea, vomiting, abdominal pain and diarrhea.  Genitourinary: Negative for dysuria and frequency.  All other systems reviewed and are negative.   Allergies  Review of patient's allergies indicates no known allergies.  Home Medications   Current Outpatient Rx  Name  Route  Sig  Dispense  Refill  . alendronate (FOSAMAX) 70 MG tablet   Oral   Take 1 tablet (70 mg total) by mouth every 7 (seven) days. Take with a full glass of water on an empty stomach.   12 tablet   3   . atorvastatin (LIPITOR) 20 MG tablet   Oral   Take 20 mg by mouth daily.         Marland Kitchen diltiazem (TIAZAC) 360 MG 24 hr capsule   Oral   Take 360 mg by mouth daily.         . furosemide (LASIX) 20 MG tablet   Oral   Take 20-40 mg by mouth daily.         Marland Kitchen losartan (COZAAR) 100 MG tablet  Oral   Take 100 mg by mouth daily.          Marland Kitchen. oxybutynin (DITROPAN-XL) 10 MG 24 hr tablet   Oral   Take 10 mg by mouth daily.         . potassium chloride (K-DUR,KLOR-CON) 10 MEQ tablet   Oral   Take 10-20 mEq by mouth daily.         Marland Kitchen. warfarin (COUMADIN) 5 MG tablet   Oral   Take 2.5-5 mg by mouth daily. Take 1 tablet (5 mg) on Monday and Friday, take 1/2 tablet (2.5 mg) on Sunday, Tuesday, Wednesday, Thursday, Saturday - per anti-coagulation visit 07/19/13         . beta carotene w/minerals (OCUVITE) tablet   Oral   Take 1 tablet by mouth daily.         . Biotin 5000 MCG CAPS   Oral   Take 1,000 mcg by mouth daily.          Marland Kitchen. CALCIUM-VITAMIN D PO   Oral   Take by mouth. 600MG  BID.          . Multiple Vitamins-Minerals (OCUVITE EXTRA PO)   Oral   Take by mouth.           . nitroGLYCERIN (NITROSTAT) 0.4 MG SL tablet   Sublingual   Place 0.4 mg under the tongue every 5 (five) minutes as needed for chest pain.         Marland Kitchen. omeprazole (PRILOSEC) 20 MG capsule    Oral   Take 20 mg by mouth daily.         Marland Kitchen. tiotropium (SPIRIVA) 18 MCG inhalation capsule   Inhalation   Place 18 mcg into inhaler and inhale daily.         . vitamin D, CHOLECALCIFEROL, 400 UNITS tablet   Oral   Take 400 Units by mouth daily. 2000 units          BP 131/62  Pulse 90  Temp(Src) 95.3 F (35.2 C) (Rectal)  Resp 28  Wt 138 lb 14.2 oz (63 kg)  SpO2 72% Physical Exam  Nursing note and vitals reviewed. Constitutional: She is oriented to person, place, and time. She appears well-developed and well-nourished. She appears ill.  HENT:  Head: Normocephalic and atraumatic.  Eyes: Conjunctivae are normal. Pupils are equal, round, and reactive to light.  Neck: Normal range of motion. Neck supple.  Cardiovascular: Regular rhythm.  Bradycardia present.  Exam reveals no gallop and no friction rub.   No murmur heard. Pulmonary/Chest: Effort normal and breath sounds normal.  Abdominal: Soft. She exhibits no distension. There is tenderness in the left lower quadrant.  Genitourinary: Guaiac positive stool.  Gross melena on exam with some gross blood.   Musculoskeletal: Normal range of motion. She exhibits no edema and no tenderness.  Neurological: She is alert and oriented to person, place, and time. She has normal strength and normal reflexes. No cranial nerve deficit or sensory deficit.  Skin: Skin is warm and dry.  Psychiatric: She has a normal mood and affect.    ED Course  Procedures (including critical care time) Labs Review Labs Reviewed  PROTIME-INR - Abnormal; Notable for the following:    Prothrombin Time 24.5 (*)    INR 2.29 (*)    All other components within normal limits  CBC WITH DIFFERENTIAL - Abnormal; Notable for the following:    WBC 19.0 (*)    Platelets 122 (*)    Neutrophils Relative %  86 (*)    Neutro Abs 16.4 (*)    Lymphocytes Relative 6 (*)    Monocytes Absolute 1.5 (*)    All other components within normal limits  COMPREHENSIVE  METABOLIC PANEL - Abnormal; Notable for the following:    CO2 14 (*)    Glucose, Bld 124 (*)    BUN 36 (*)    Creatinine, Ser 1.74 (*)    Total Protein 5.7 (*)    Albumin 3.1 (*)    AST 165 (*)    ALT 177 (*)    GFR calc non Af Amer 26 (*)    GFR calc Af Amer 30 (*)    All other components within normal limits  PRO B NATRIURETIC PEPTIDE - Abnormal; Notable for the following:    Pro B Natriuretic peptide (BNP) 2346.0 (*)    All other components within normal limits  GLUCOSE, CAPILLARY - Abnormal; Notable for the following:    Glucose-Capillary 131 (*)    All other components within normal limits  URINALYSIS, ROUTINE W REFLEX MICROSCOPIC - Abnormal; Notable for the following:    APPearance TURBID (*)    Hgb urine dipstick LARGE (*)    Protein, ur 100 (*)    Leukocytes, UA MODERATE (*)    All other components within normal limits  URINE MICROSCOPIC-ADD ON - Abnormal; Notable for the following:    Bacteria, UA MANY (*)    All other components within normal limits  OCCULT BLOOD, POC DEVICE - Abnormal; Notable for the following:    Fecal Occult Bld POSITIVE (*)    All other components within normal limits  POCT I-STAT, CHEM 8 - Abnormal; Notable for the following:    BUN 35 (*)    Creatinine, Ser 1.90 (*)    Glucose, Bld 118 (*)    All other components within normal limits  CG4 I-STAT (LACTIC ACID) - Abnormal; Notable for the following:    Lactic Acid, Venous 9.99 (*)    All other components within normal limits  POCT I-STAT TROPONIN I - Abnormal; Notable for the following:    Troponin i, poc 1.43 (*)    All other components within normal limits  POCT I-STAT 3, BLOOD GAS (G3+) - Abnormal; Notable for the following:    pH, Arterial 7.173 (*)    pCO2 arterial 30.2 (*)    pO2, Arterial 281.0 (*)    Bicarbonate 11.1 (*)    Acid-base deficit 16.0 (*)    All other components within normal limits  URINE CULTURE  LIPASE, BLOOD  BLOOD GAS, ARTERIAL  LACTIC ACID, PLASMA  BASIC  METABOLIC PANEL  CBC  PROTIME-INR  TROPONIN I  TROPONIN I  PREPARE RBC (CROSSMATCH)  TYPE AND SCREEN  ABO/RH   Imaging Review Dg Chest Portable 1 View  08/10/2013   CLINICAL DATA:  Myocardial infarction  EXAM: PORTABLE CHEST - 1 VIEW  COMPARISON:  None.  FINDINGS: Mild to moderate cardiac enlargement. Calcification of the aortic arch. Vascular pattern normal. Lungs clear.  IMPRESSION: Mild to moderate cardiac enlargement. No other significant findings.   Electronically Signed   By: Esperanza Heir M.D.   On: 08/17/2013 16:25    EKG Interpretation    Date/Time:  Friday August 04 2013 15:13:01 EST Ventricular Rate:  36 PR Interval:    QRS Duration: 103 QT Interval:  591 QTC Calculation: 457 R Axis:   73 Text Interpretation:  Junctional rhythm Anterior infarct, old Repol abnrm suggests ischemia, lateral leads Inferior injury pattern  New since previous tracing Confirmed by JACUBOWITZ  MD, SAM ([id number]) on September 01, 2013 3:25:11 PM           MDM   1. Coronary atherosclerosis of native coronary artery   2. Old myocardial infarction   3. Shock   4. Atrial fibrillation   5. MI (myocardial infarction)   6. Acute respiratory failure     Here with diarrhea and pre-syncopal symptoms. Initial pressure of 77/49. Denies chest pain, but EKG with STE in inferior leads with some reciprocal changes in lateral leads. Complaining of abdominal pain. LLQ with tenderness. Rectal performed with gross blood and dark tarry stool. Concern for GI bleed. Code STEMI not called in setting of GIB. Cardiology was called and immediately evaluated the patient. Atropine given without improvement in rate. PCCM and GI consulted. PCCM ordered dopamine with transient improvement in heart rate. The patient was given 1 unit of emergency release blood with improvement in her blood pressure. She was given Protonix IV for possible GIB. Symptoms most c/w diverticulitis given leukocytosis, LLQ pain and diarrhea. Will obtain CT  abdomen pelvis to further evaluate. Cardiology ordered bedside echo. Per cardiology there were no left ventricular wall motion abnormalities. The patient was admitted to the ICU for continued workup and management.    Shanon Ace, MD 08/05/13 [phone]

## 2013-09-03 NOTE — Progress Notes (Signed)
Patient extubated per MD order

## 2013-09-03 DEATH — deceased

## 2014-03-13 IMAGING — CT CT ABD-PELV W/ CM
3 of 5 series · 13 of 36 positions shown, 19 images · IV contrast (READICAT/WATER & [ID] OMNI 300)
Comparison: Report of prior CT scan of the abdomen dated 10/28/2000

CLINICAL DATA: Bilateral lower extremity swelling.

CT ABDOMEN AND PELVIS WITH CONTRAST
TECHNIQUE: Multidetector CT imaging of the abdomen and pelvis was
performed following the standard protocol during bolus
administration of intravenous contrast.
Contrast: 100mL OMNIPAQUE IOHEXOL 300 MG/ML  SOLN

[Series 3: abd/pelvis with · axial · 0.70mm/px · z∈[-366,-51]mm · 8 of 82 slices shown, 13 images]
[im 10/82  soft-tissue]
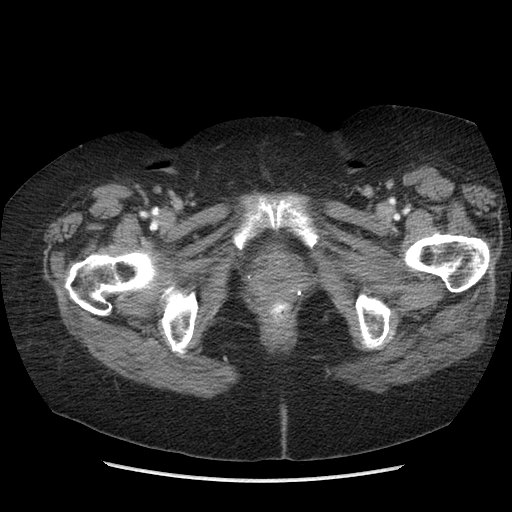
[im 10/82  bone]
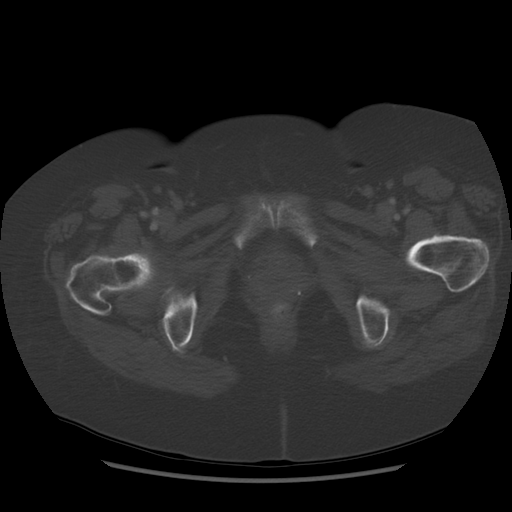
[im 19/82  soft-tissue]
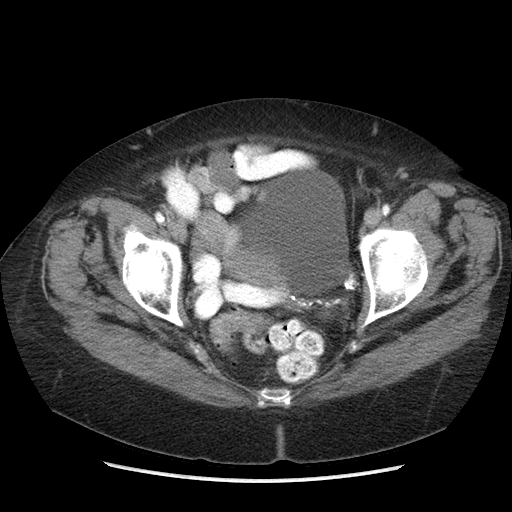
[im 28/82  soft-tissue]
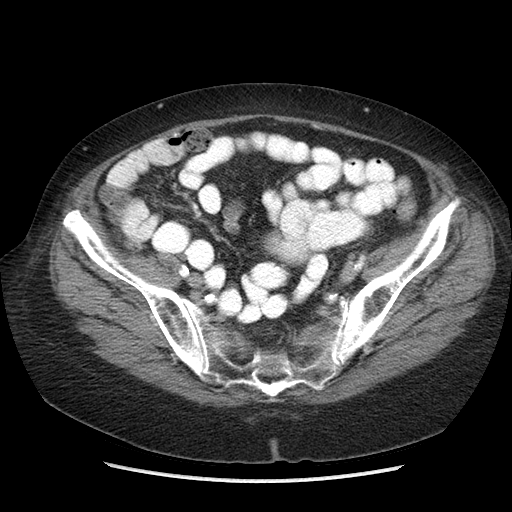
[im 37/82  soft-tissue]
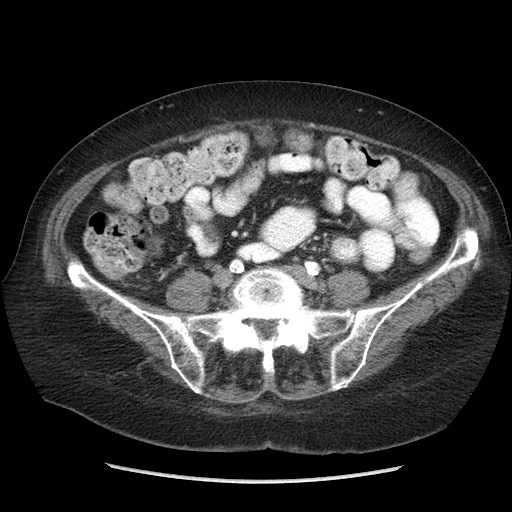
[im 46/82  soft-tissue]
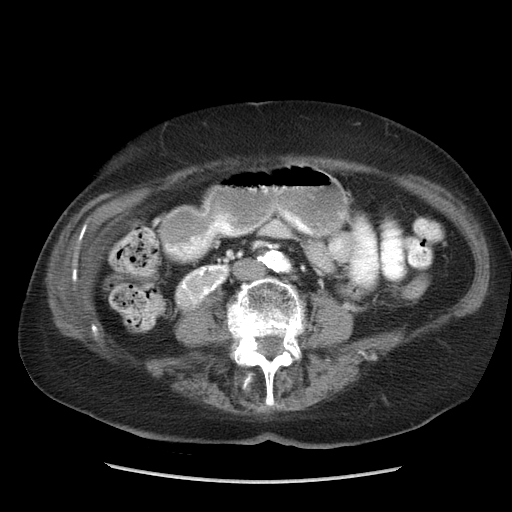
[im 46/82  lung]
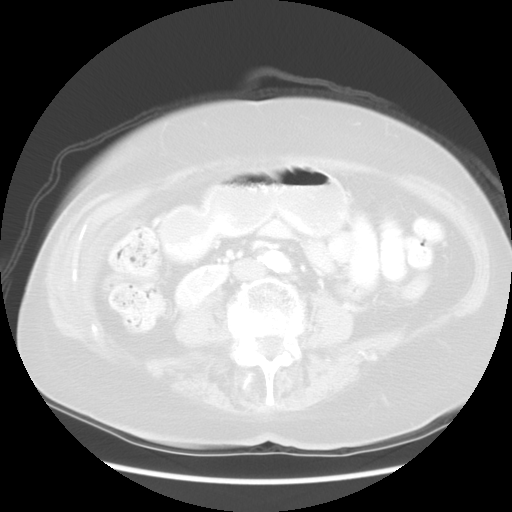
[im 55/82  soft-tissue]
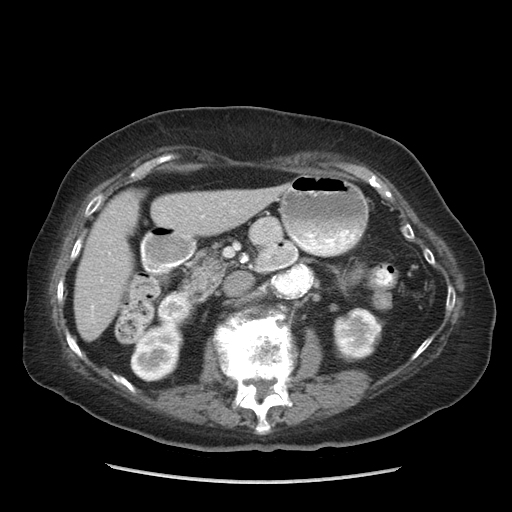
[im 55/82  lung]
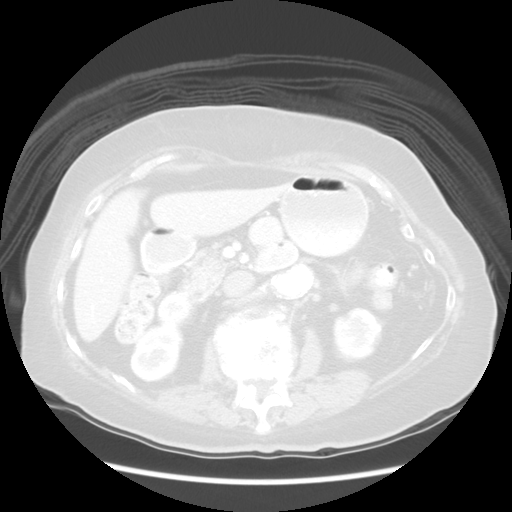
[im 64/82  soft-tissue]
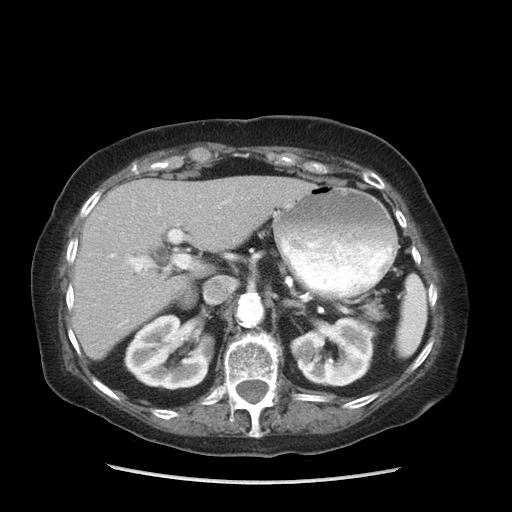
[im 64/82  lung]
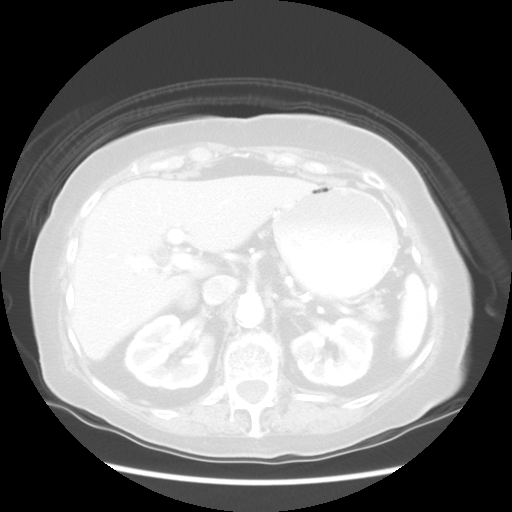
[im 73/82  soft-tissue]
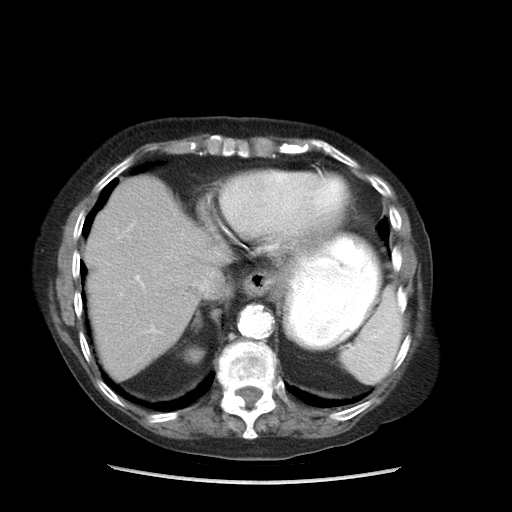
[im 73/82  lung]
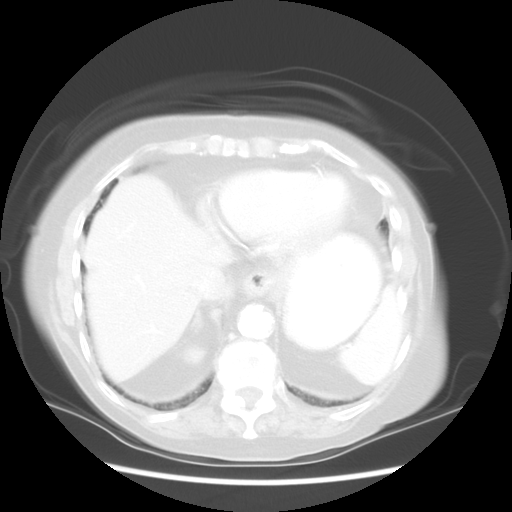

[Series 601: coronal body · coronal · 0.81mm/px · 1 of 117 slices shown, 2 images]
[im 39/117  soft-tissue]
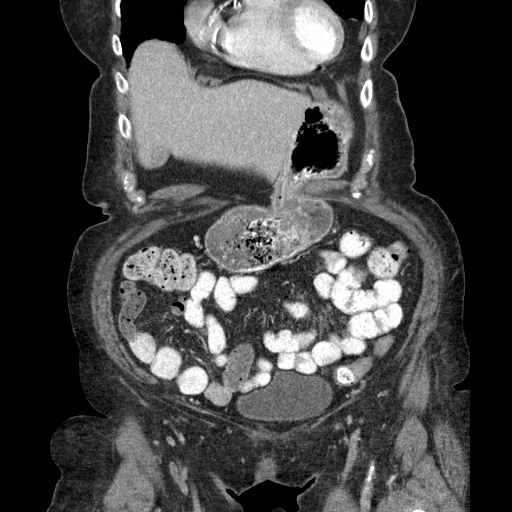
[im 39/117  bone]
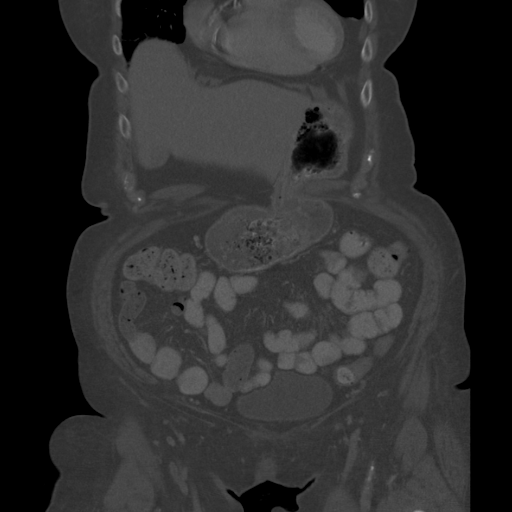

[Series 602: sagittal body · sagittal · 0.81mm/px · 4 of 145 slices shown]
[im 10/145  soft-tissue]
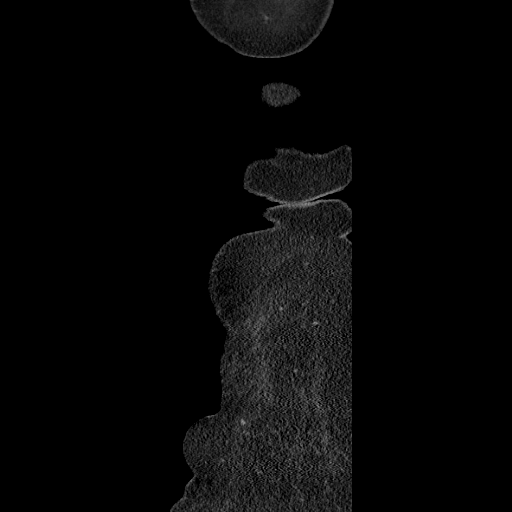
[im 28/145  soft-tissue]
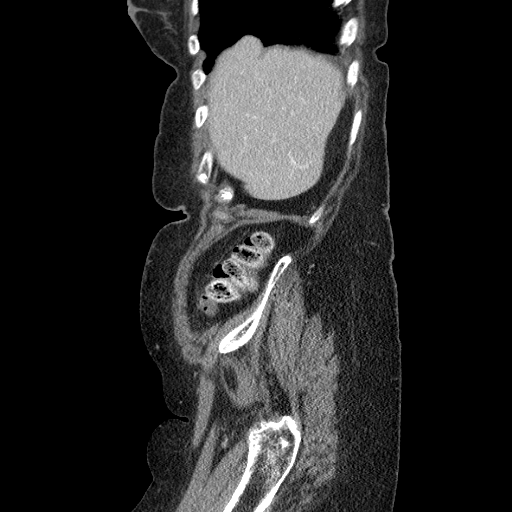
[im 46/145  soft-tissue]
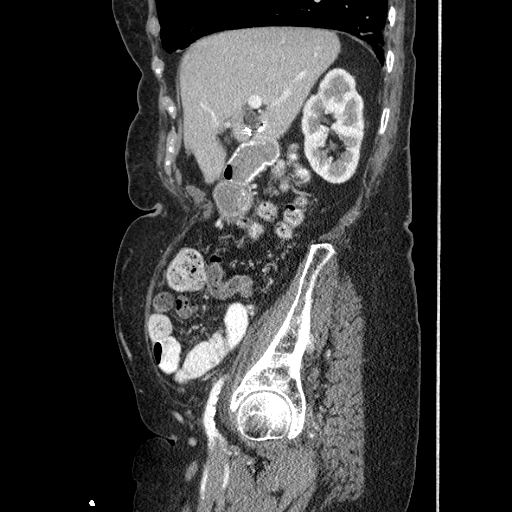
[im 64/145  soft-tissue]
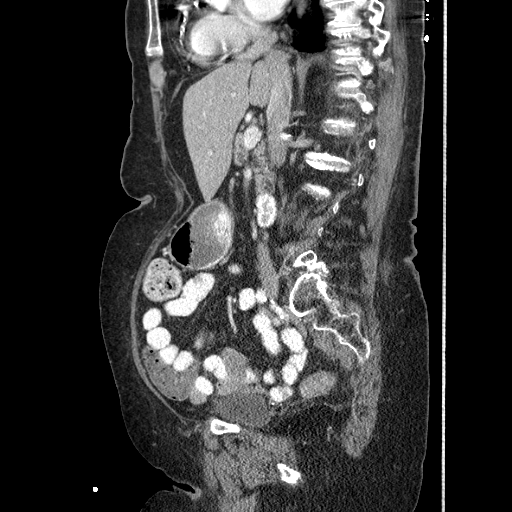

[13 of 36 positions shown; findings below may reference images not displayed]

FINDINGS: Heart size is normal and the lung bases are clear.  The
liver, spleen, and kidneys are normal except for vascular
calcifications in the kidneys.  There is a 13 mm adenoma in the
left adrenal gland, previously reported on 10/28/2000 and on
01/26/1999.

There is a 9 mm low density lesion in the tail of the pancreas on
image number 25 of series 3 which probably represents a benign
mucinous tumor of the pancreas.  The remainder of the pancreas is
normal.

Gallbladder has been removed.  No significant dilatation of bile
ducts.

There are numerous diverticula in the distal colon.  Bowel is
otherwise normal including the terminal ileum and appendix.

The patient has extensive calcification as well as tortuosity of
the abdominal aorta is well as calcification in the iliac arteries.
The uterus and right ovary are normal.  The left ovary is not
identified.

The patient has fairly severe degenerative disc disease at L1-2 and
L2-3 as well as severe facet arthritis in the lower lumbar spine.

There is no evidence of lymphatic or venous obstruction.  There is
no edema in the soft tissues of the abdomen or pelvis.  No
adenopathy.
IMPRESSION: No acute abnormality of the abdomen or pelvis.  Specifically, no
evidence of lymphatic or venous obstruction. Sigmoid
diverticulosis.  Tiny mucinous tumor of the tail of the pancreas,
most likely benign.

## 2014-06-04 ENCOUNTER — Encounter (HOSPITAL_COMMUNITY): Payer: Self-pay | Admitting: Emergency Medicine
